# Patient Record
Sex: Female | Born: 1990 | Race: Black or African American | Hispanic: No | Marital: Single | Smoking: Never smoker
Health system: Southern US, Community
[De-identification: ages and names within clinical notes are randomized; demographics above are authoritative.]

## PROBLEM LIST (undated history)

## (undated) ENCOUNTER — Inpatient Hospital Stay: Payer: Self-pay

## (undated) DIAGNOSIS — Z789 Other specified health status: Secondary | ICD-10-CM

## (undated) HISTORY — PX: NO PAST SURGERIES: SHX2092

## (undated) HISTORY — PX: LIPOSUCTION: SHX10

---

## 2016-07-19 ENCOUNTER — Encounter: Payer: Self-pay | Admitting: Certified Nurse Midwife

## 2016-07-19 ENCOUNTER — Ambulatory Visit (INDEPENDENT_AMBULATORY_CARE_PROVIDER_SITE_OTHER): Payer: Medicaid Other | Admitting: Certified Nurse Midwife

## 2016-07-19 VITALS — BP 106/71 | HR 93 | Temp 99.4°F | Ht 62.0 in | Wt 142.8 lb

## 2016-07-19 DIAGNOSIS — K641 Second degree hemorrhoids: Secondary | ICD-10-CM

## 2016-07-19 DIAGNOSIS — O0932 Supervision of pregnancy with insufficient antenatal care, second trimester: Secondary | ICD-10-CM

## 2016-07-19 DIAGNOSIS — Z3482 Encounter for supervision of other normal pregnancy, second trimester: Secondary | ICD-10-CM

## 2016-07-19 DIAGNOSIS — Z3480 Encounter for supervision of other normal pregnancy, unspecified trimester: Secondary | ICD-10-CM | POA: Insufficient documentation

## 2016-07-19 MED ORDER — HYDROCORTISONE ACETATE 25 MG RE SUPP: 25.0000 mg | Freq: Two times a day (BID) | RECTAL | 99 refills | Status: DC

## 2016-07-19 NOTE — Patient Instructions (Addendum)
Hemorrhoids Hemorrhoids are swollen veins around the rectum or anus. There are two types of hemorrhoids:   Internal hemorrhoids. These occur in the veins just inside the rectum. They may poke through to the outside and become irritated and painful.  External hemorrhoids. These occur in the veins outside the anus and can be felt as a painful swelling or hard lump near the anus. CAUSES  Pregnancy.   Obesity.   Constipation or diarrhea.   Straining to have a bowel movement.   Sitting for long periods on the toilet.  Heavy lifting or other activity that caused you to strain.  Anal intercourse. SYMPTOMS   Pain.   Anal itching or irritation.   Rectal bleeding.   Fecal leakage.   Anal swelling.   One or more lumps around the anus.  DIAGNOSIS  Your caregiver may be able to diagnose hemorrhoids by visual examination. Other examinations or tests that may be performed include:   Examination of the rectal area with a gloved hand (digital rectal exam).   Examination of anal canal using a small tube (scope).   A blood test if you have lost a significant amount of blood.  A test to look inside the colon (sigmoidoscopy or colonoscopy). TREATMENT Most hemorrhoids can be treated at home. However, if symptoms do not seem to be getting better or if you have a lot of rectal bleeding, your caregiver may perform a procedure to help make the hemorrhoids get smaller or remove them completely. Possible treatments include:   Placing a rubber band at the base of the hemorrhoid to cut off the circulation (rubber band ligation).   Injecting a chemical to shrink the hemorrhoid (sclerotherapy).   Using a tool to burn the hemorrhoid (infrared light therapy).   Surgically removing the hemorrhoid (hemorrhoidectomy).   Stapling the hemorrhoid to block blood flow to the tissue (hemorrhoid stapling).  HOME CARE INSTRUCTIONS   Eat foods with fiber, such as whole grains, beans,  nuts, fruits, and vegetables. Ask your doctor about taking products with added fiber in them (fibersupplements).  Increase fluid intake. Drink enough water and fluids to keep your urine clear or pale yellow.   Exercise regularly.   Go to the bathroom when you have the urge to have a bowel movement. Do not wait.   Avoid straining to have bowel movements.   Keep the anal area dry and clean. Use wet toilet paper or moist towelettes after a bowel movement.   Medicated creams and suppositories may be used or applied as directed.   Only take over-the-counter or prescription medicines as directed by your caregiver.   Take warm sitz baths for 15-20 minutes, 3-4 times a day to ease pain and discomfort.   Place ice packs on the hemorrhoids if they are tender and swollen. Using ice packs between sitz baths may be helpful.   Put ice in a plastic bag.   Place a towel between your skin and the bag.   Leave the ice on for 15-20 minutes, 3-4 times a day.   Do not use a donut-shaped pillow or sit on the toilet for long periods. This increases blood pooling and pain.  SEEK MEDICAL CARE IF:  You have increasing pain and swelling that is not controlled by treatment or medicine.  You have uncontrolled bleeding.  You have difficulty or you are unable to have a bowel movement.  You have pain or inflammation outside the area of the hemorrhoids. MAKE SURE YOU:  Understand these instructions.    Will watch your condition.  Will get help right away if you are not doing well or get worse.   This information is not intended to replace advice given to you by your health care provider. Make sure you discuss any questions you have with your health care provider.   Document Released: 10/05/2000 Document Revised: 09/24/2012 Document Reviewed: 08/12/2012 Elsevier Interactive Patient Education 2016 [employer]. Pregnancy, The Father's Role A father has an important role during his  partner's pregnancy, labor, delivery, and after the birth of the baby. It is important to help and support your partner through this new period. There are many physical and emotional changes that happen. To be helpful and supportive during this time, you should know and understand what is happening to your partner during pregnancy, labor, delivery, and after the baby is born. WHAT ARE THE STAGES OF PREGNANCY? Pregnancy usually lasts about 40 weeks. The pregnancy is divided into three trimesters. First Trimester During the first 13 weeks, your partner may:  Feel tired.  Have painful breasts.  Feel nauseous or throw up.  Urinate more often.  Have mood changes. All of these changes are normal. If they are happening, try to be helpful, supportive, and understanding. This may include helping with household duties and activities and spending more time with each other. Second Trimester During the next 14-28 weeks:  Your partner will likely feel better and more energetic.  This is the best time of the pregnancy to be more active together.  You will be able to see her belly showing the pregnancy.  You may be able to feel the baby kick.  Your partner may have soreness or aching in her back as she gains weight. You can help her by carrying heavy things and by rubbing her back when she is feeling sore. Third Trimester During the final 12 weeks, your partner may:  Become more uncomfortable as the baby grows.  Have a hard time doing everyday activities, and her balance may be off.  Have a hard time bending over.  Tire easily.  Have difficulty sleeping. At this time, the birth of your baby is close. You and your partner may have concerns or questions. This is normal. Talk with each other and with your health care provider. Continue to help your partner with housework, encourage her to rest, and rub her sore back and legs, if this helps her. WHAT CAN I EXPECT OR DO DURING THE PREGNANCY? You  can expect to experience some changes. There are also many things you can do to help prepare you and your partner for your baby. Emotional Changes During your partner's pregnancy, emotional changes for you may include:  Having feelings of happiness, excitement, and pride.  Being concerned about having new responsibilities, such as financial or educational responsibilities.  Feeling overwhelmed or scared.  Being worried that a baby will change your relationship with your partner. These feelings are normal. Talk about them openly with your partner and your health care provider. Prenatal Care Attend prenatal care visits with your partner. This is a good time for you to get to know your health care provider, follow the pregnancy, and ask questions.  Prenatal visits usually occur one time each month for six months, then every two weeks for two months, and then one time each week during the last month. You may have more prenatal visits if your health care provider believes this is needed.  Your health care provider usually does an ultrasound of the baby  at one of the prenatal visits. This may happen more often if your health care provider thinks it is needed. Sexual Activity Sexual intercourse is safe unless there is a problem with the pregnancy and your health care provider advises you to not have sexual intercourse. Because physical and emotional changes happen in pregnancy, your partner may not want to have sex during certain times. Trying different positions may make sexual intercourse more comfortable. However, always respect your partner's decision if she does not want to have sex. It is important for both of you to discuss your feelings and desires. Talk with your health care provider about any questions that you may have about sexual intercourse during pregnancy. Childbirth Classes Attend childbirth classes with your partner if you are able. Classes prepare you and help you to understand what  happens during labor and delivery, and they help you and your partner to bond. There are even some classes that are only for new fathers. Classes also teach you and your partner:  Various relaxation techniques.  How to work with her labor pains.  How to focus during labor and delivery. WHAT SHOULD I KNOW ABOUT LABOR AND DELIVERY? Many fathers want to be present while their partner is going through labor and delivery. You may:  Be asked to time the contractions, massage your partner's back, and breathe with her during the contractions.  Get to see and enjoy the excitement of your baby being born, and you may even be able to cut your baby's umbilical cord. If you feel like you might faint or you are uncomfortable, ask someone to help you.  Need to leave the room if a problem develops during labor or delivery. A cesarean delivery, or C-section, is a procedure that may be used to deliver the baby. It is done through an incision in the abdomen and the uterus. A cesarean delivery may be scheduled or it may be an emergency procedure during labor and delivery. Most hospitals allow the father to be in the room for a cesarean delivery unless it is an emergency. Recovery from a cesarean delivery usually requires more help from the father. WHAT HAPPENS AFTER DELIVERY? After your baby is born, your partner will go through many changes again. These changes could last a few months or longer. Postpartum Depression Your partner may take awhile to regain her strength. She may also have feelings of sadness (postpartum blues or postpartum depression). If your partner is acting unusually sad or depressed, talk with your health care provider right away. This can be a serious medical condition that requires treatment. Breastfeeding Your partner may decide to breastfeed the baby. This helps with bonding between the mother and the baby, and breast milk is the best nutrition for your baby. You can feel included by  burping the baby and bottle-feeding the baby with breast milk that was collected from the mother. This allows your partner to rest and helps you to bond with your baby. Sexual Activity It may take a few months for your partner's body to heal and be ready for sexual intercourse again. This may take longer after a cesarean delivery. If you have any questions about having sexual intercourse or if it is painful for your partner, talk with your health care provider. It is possible for breastfeeding mothers to become pregnant even if they are not having menstrual periods. Use birth control (contraception) unless you and your partner would like to become pregnant again. WHAT SHOULD I REMEMBER? Fatherhood and having a  baby is an ongoing learning experience. It is common to be anxious, concerned, or afraid that you may not be taking care of your newborn baby properly. It is important to talk with your partner and your health care provider if you are worried or have any questions.   This information is not intended to replace advice given to you by your health care provider. Make sure you discuss any questions you have with your health care provider.   Document Released: 03/26/2008 Document Revised: 10/29/2014 Document Reviewed: 06/25/2014 Elsevier Interactive Patient Education 2016 [employer]. Preterm Labor Information Preterm labor is when labor starts at less than 37 weeks of pregnancy. The normal length of a pregnancy is 39 to 41 weeks. CAUSES Often, there is no identifiable underlying cause as to why a woman goes into preterm labor. One of the most common known causes of preterm labor is infection. Infections of the uterus, cervix, vagina, amniotic sac, bladder, kidney, or even the lungs (pneumonia) can cause labor to start. Other suspected causes of preterm labor include:   Urogenital infections, such as yeast infections and bacterial vaginosis.   Uterine abnormalities (uterine shape, uterine  septum, fibroids, or bleeding from the placenta).   A cervix that has been operated on (it may fail to stay closed).   Malformations in the fetus.   Multiple gestations (twins, triplets, and so on).   Breakage of the amniotic sac.  RISK FACTORS  Having a previous history of preterm labor.   Having premature rupture of membranes (PROM).   Having a placenta that covers the opening of the cervix (placenta previa).   Having a placenta that separates from the uterus (placental abruption).   Having a cervix that is too weak to hold the fetus in the uterus (incompetent cervix).   Having too much fluid in the amniotic sac (polyhydramnios).   Taking illegal drugs or smoking while pregnant.   Not gaining enough weight while pregnant.   Being younger than 17 and older than 25 years old.   Having a low socioeconomic status.   Being African American. SYMPTOMS Signs and symptoms of preterm labor include:   Menstrual-like cramps, abdominal pain, or back pain.  Uterine contractions that are regular, as frequent as six in an hour, regardless of their intensity (may be mild or painful).  Contractions that start on the top of the uterus and spread down to the lower abdomen and back.   A sense of increased pelvic pressure.   A watery or bloody mucus discharge that comes from the vagina.  TREATMENT Depending on the length of the pregnancy and other circumstances, your health care provider may suggest bed rest. If necessary, there are medicines that can be given to stop contractions and to mature the fetal lungs. If labor happens before 34 weeks of pregnancy, a prolonged hospital stay may be recommended. Treatment depends on the condition of both you and the fetus.  WHAT SHOULD YOU DO IF YOU THINK YOU ARE IN PRETERM LABOR? Call your health care provider right away. You will need to go to the hospital to get checked immediately. HOW CAN YOU PREVENT PRETERM LABOR IN FUTURE  PREGNANCIES? You should:   Stop smoking if you smoke.  Maintain healthy weight gain and avoid chemicals and drugs that are not necessary.  Be watchful for any type of infection.  Inform your health care provider if you have a known history of preterm labor.   This information is not intended to replace advice given  to you by your health care provider. Make sure you discuss any questions you have with your health care provider.   Document Released: 12/29/2003 Document Revised: 06/10/2013 Document Reviewed: 11/10/2012 Elsevier Interactive Patient Education 2016 [employer] Second Trimester of Pregnancy The second trimester is from week 13 through week 28, months 4 through 6. The second trimester is often a time when you feel your best. Your body has also adjusted to being pregnant, and you begin to feel better physically. Usually, morning sickness has lessened or quit completely, you may have more energy, and you may have an increase in appetite. The second trimester is also a time when the fetus is growing rapidly. At the end of the sixth month, the fetus is about 9 inches long and weighs about 1 pounds. You will likely begin to feel the baby move (quickening) between 18 and 20 weeks of the pregnancy. BODY CHANGES Your body goes through many changes during pregnancy. The changes vary from woman to woman.   Your weight will continue to increase. You will notice your lower abdomen bulging out.  You may begin to get stretch marks on your hips, abdomen, and breasts.  You may develop headaches that can be relieved by medicines approved by your health care provider.  You may urinate more often because the fetus is pressing on your bladder.  You may develop or continue to have heartburn as a result of your pregnancy.  You may develop constipation because certain hormones are causing the muscles that push waste through your intestines to slow down.  You may develop hemorrhoids or swollen,  bulging veins (varicose veins).  You may have back pain because of the weight gain and pregnancy hormones relaxing your joints between the bones in your pelvis and as a result of a shift in weight and the muscles that support your balance.  Your breasts will continue to grow and be tender.  Your gums may bleed and may be sensitive to brushing and flossing.  Dark spots or blotches (chloasma, mask of pregnancy) may develop on your face. This will likely fade after the baby is born.  A dark line from your belly button to the pubic area (linea nigra) may appear. This will likely fade after the baby is born.  You may have changes in your hair. These can include thickening of your hair, rapid growth, and changes in texture. Some women also have hair loss during or after pregnancy, or hair that feels dry or thin. Your hair will most likely return to normal after your baby is born. WHAT TO EXPECT AT YOUR PRENATAL VISITS During a routine prenatal visit:  You will be weighed to make sure you and the fetus are growing normally.  Your blood pressure will be taken.  Your abdomen will be measured to track your baby's growth.  The fetal heartbeat will be listened to.  Any test results from the previous visit will be discussed. Your health care provider may ask you:  How you are feeling.  If you are feeling the baby move.  If you have had any abnormal symptoms, such as leaking fluid, bleeding, severe headaches, or abdominal cramping.  If you are using any tobacco products, including cigarettes, chewing tobacco, and electronic cigarettes.  If you have any questions. Other tests that may be performed during your second trimester include:  Blood tests that check for:  Low iron levels (anemia).  Gestational diabetes (between 24 and 28 weeks).  Rh antibodies.  Urine tests  to check for infections, diabetes, or protein in the urine.  An ultrasound to confirm the proper growth and development  of the baby.  An amniocentesis to check for possible genetic problems.  Fetal screens for spina bifida and Down syndrome.  HIV (human immunodeficiency virus) testing. Routine prenatal testing includes screening for HIV, unless you choose not to have this test. HOME CARE INSTRUCTIONS   Avoid all smoking, herbs, alcohol, and unprescribed drugs. These chemicals affect the formation and growth of the baby.  Do not use any tobacco products, including cigarettes, chewing tobacco, and electronic cigarettes. If you need help quitting, ask your health care provider. You may receive counseling support and other resources to help you quit.  Follow your health care provider's instructions regarding medicine use. There are medicines that are either safe or unsafe to take during pregnancy.  Exercise only as directed by your health care provider. Experiencing uterine cramps is a good sign to stop exercising.  Continue to eat regular, healthy meals.  Wear a good support bra for breast tenderness.  Do not use hot tubs, steam rooms, or saunas.  Wear your seat belt at all times when driving.  Avoid raw meat, uncooked cheese, cat litter boxes, and soil used by cats. These carry germs that can cause birth defects in the baby.  Take your prenatal vitamins.  Take 1500-2000 mg of calcium daily starting at the 20th week of pregnancy until you deliver your baby.  Try taking a stool softener (if your health care provider approves) if you develop constipation. Eat more high-fiber foods, such as fresh vegetables or fruit and whole grains. Drink plenty of fluids to keep your urine clear or pale yellow.  Take warm sitz baths to soothe any pain or discomfort caused by hemorrhoids. Use hemorrhoid cream if your health care provider approves.  If you develop varicose veins, wear support hose. Elevate your feet for 15 minutes, 3-4 times a day. Limit salt in your diet.  Avoid heavy lifting, wear low heel shoes, and  practice good posture.  Rest with your legs elevated if you have leg cramps or low back pain.  Visit your dentist if you have not gone yet during your pregnancy. Use a soft toothbrush to brush your teeth and be gentle when you floss.  A sexual relationship may be continued unless your health care provider directs you otherwise.  Continue to go to all your prenatal visits as directed by your health care provider. SEEK MEDICAL CARE IF:   You have dizziness.  You have mild pelvic cramps, pelvic pressure, or nagging pain in the abdominal area.  You have persistent nausea, vomiting, or diarrhea.  You have a bad smelling vaginal discharge.  You have pain with urination. SEEK IMMEDIATE MEDICAL CARE IF:   You have a fever.  You are leaking fluid from your vagina.  You have spotting or bleeding from your vagina.  You have severe abdominal cramping or pain.  You have rapid weight gain or loss.  You have shortness of breath with chest pain.  You notice sudden or extreme swelling of your face, hands, ankles, feet, or legs.  You have not felt your baby move in over an hour.  You have severe headaches that do not go away with medicine.  You have vision changes.   This information is not intended to replace advice given to you by your health care provider. Make sure you discuss any questions you have with your health care provider.  Document Released: 10/02/2001 Document Revised: 10/29/2014 Document Reviewed: 12/09/2012 Elsevier Interactive Patient Education 2016 [employer] Inc[employer] Surgical Procedures for Hemorrhoids Surgical procedures can be used to treat hemorrhoids. Hemorrhoids are swollen veins that are inside the rectum (internal hemorrhoids) or around the anus (external hemorrhoids). They are caused by increased pressure in the anal area. This pressure may result from straining to have a bowel movement (constipation), diarrhea, pregnancy, obesity, anal sex, or sitting for long  periods of time. Hemorrhoids can cause symptoms such as pain and bleeding. Surgery may be needed if diet changes, lifestyle changes, and other treatments do not help your symptoms. Various surgical methods may be used. Three common methods are:  Closed hemorrhoidectomy. The hemorrhoids are surgically removed, and the surgical cuts (incisions) are closed with stitches (sutures).  Open hemorrhoidectomy. The hemorrhoids are surgically removed, but the incisions are allowed to heal without sutures.  Stapled hemorrhoidopexy. The hemorrhoids are removed using a device that takes out a ring of excess tissue. LET [facility] CARE PROVIDER KNOW ABOUT:  Any allergies you have.  All medicines you are taking, including vitamins, herbs, eye drops, creams, and over-the-counter medicines.  Previous problems you or members of your family have had with the use of anesthetics.  Any blood disorders you have.  Previous surgeries you have had.  Any medical conditions you have.  Whether you are pregnant or may be pregnant. RISKS AND COMPLICATIONS Generally, this is a safe procedure. However, problems may occur, including:  Infection.  Bleeding.  Allergic reactions to medicines.  Damage to other structures or organs.  Pain.  Constipation.  Difficulty passing urine.  Narrowing of the anal canal (stenosis).  Difficulty controlling bowel movements (incontinence). BEFORE THE PROCEDURE  Ask your health care provider about:  Changing or stopping your regular medicines. This is especially important if you are taking diabetes medicines or blood thinners.  Taking medicines such as aspirin and ibuprofen. These medicines can thin your blood. Do not take these medicines before your procedure if your health care provider instructs you not to.  You may need to have a procedure to examine the inside of your colon with a scope (colonoscopy). Your health care provider may do this to make sure that there  are no other causes for your bleeding or pain.  Follow instructions from your health care provider about eating or drinking restrictions.  You may be instructed to take a laxative and an enema to clean out your colon before surgery (bowel prep). Carefully follow instructions from your health care provider about bowel prep.  Ask your health care provider how your surgical site will be marked or identified.  You may be given antibiotic medicine to help prevent infection.  Plan to have someone take you home after the procedure. PROCEDURE  To reduce your risk of infection:  Your health care team will wash or sanitize their hands.  Your skin will be washed with soap.  An IV tube will be inserted into one of your veins.  You will be given one or more of the following:  A medicine to help you relax (sedative).  A medicine to numb the area (local anesthetic).  A medicine to make you fall asleep (general anesthetic).  A medicine that is injected into an area of your body to numb everything below the injection site (regional anesthetic).  A lubricating jelly may be placed into your rectum.  Your surgeon will insert a short scope (anoscope) into your rectum to examine the hemorrhoids.  One  of the following hemorrhoid procedures will be performed. Closed Hemorrhoidectomy  Your surgeon will use surgical instruments to open the tissue around the hemorrhoids.  The veins that supply the hemorrhoids will be tied off with a suture.  The hemorrhoids will be removed.  The tissue that surrounds the hemorrhoids will be closed with sutures that your body can absorb (absorbable sutures). Open Hemorrhoidectomy  The hemorrhoids will be removed with surgical instruments.  The incisions will be left open to heal without sutures. Stapled Hemorrhoidopexy  Your surgeon will use a circular stapling device to remove the hemorrhoids.  The device will be inserted into your anus. It will remove a  circular ring of tissue that includes hemorrhoid tissue and some tissue above the hemorrhoids.  The staples in the device will close the edges of removed tissue. This will cut off the blood supply to the hemorrhoids and will pull any remaining hemorrhoids back into place. Each of these procedures may vary among health care providers and hospitals. AFTER THE PROCEDURE  Your blood pressure, heart rate, breathing rate, and blood oxygen level will be monitored often until the medicines you were given have worn off.  You will be given pain medicine as needed.   This information is not intended to replace advice given to you by your health care provider. Make sure you discuss any questions you have with your health care provider.   Document Released: 08/05/2009 Document Revised: 06/29/2015 Document Reviewed: 01/03/2015 Elsevier Interactive Patient Education [employer]

## 2016-07-19 NOTE — Progress Notes (Signed)
Subjective:    Michelle Norman is being seen today for her first obstetrical visit.  This is not a planned pregnancy. She is at [redacted]w[redacted]d gestation. Her obstetrical history is significant for none. Relationship with FOB: significant other, living together. Patient does intend to breast feed. Pregnancy history fully reviewed.  Has a [employer] degree.  Currently employed.   The information documented in the HPI was reviewed and verified.  Menstrual History: OB History    Gravida Para Term Preterm AB Living   2 1 1  0 0 1   SAB TAB Ectopic Multiple Live Births   0 0 0 0 1      Menarche age: 25 years of age.  Patient's last menstrual period was 03/14/2016 (exact date).    History reviewed. No pertinent past medical history.  History reviewed. No pertinent surgical history.   (Not in a hospital admission) Not on File  Social History  Substance Use Topics  . Smoking status: Never Smoker  . Smokeless tobacco: Not on file  . Alcohol use No    Family History  Problem Relation Age of Onset  . Cancer Paternal Grandmother      Review of Systems Constitutional: negative for weight loss Gastrointestinal: negative for vomiting, neg for nausea, had it at the start of pregnancy Genitourinary:negative for genital lesions and vaginal discharge and dysuria Musculoskeletal:negative for back pain Behavioral/Psych: negative for abusive relationship, depression, illegal drug usage and tobacco use    Objective:    BP 106/71   Pulse 93   Temp 99.4 F (37.4 C)   Ht 5\' 2"  (1.575 m)   Wt 142 lb 12.8 oz (64.8 kg)   LMP 03/14/2016 (Exact Date)   BMI 26.12 kg/m  General Appearance:    Alert, cooperative, no distress, appears stated age  Head:    Normocephalic, without obvious abnormality, atraumatic  Eyes:    PERRL, conjunctiva/corneas clear, EOM's intact, fundi    benign, both eyes  Ears:    Normal TM's and external ear canals, both ears  Nose:   Nares normal, septum midline, mucosa normal, no drainage     or sinus tenderness  Throat:   Lips, mucosa, and tongue normal; teeth and gums normal  Neck:   Supple, symmetrical, trachea midline, no adenopathy;    thyroid:  no enlargement/tenderness/nodules; no carotid   bruit or JVD  Back:     Symmetric, no curvature, ROM normal, no CVA tenderness  Lungs:     Clear to auscultation bilaterally, respirations unlabored  Chest Wall:    No tenderness or deformity   Heart:    Regular rate and rhythm, S1 and S2 normal, no murmur, rub   or gallop  Breast Exam:    No tenderness, masses, or nipple abnormality  Abdomen:     Soft, non-tender, bowel sounds active all four quadrants,    no masses, no organomegaly  Genitalia:    Normal female without lesion, discharge or tenderness  Extremities:   Extremities normal, atraumatic, no cyanosis or edema  Pulses:   2+ and symmetric all extremities  Skin:   Skin color, texture, turgor normal, no rashes or lesions  Lymph nodes:   Cervical, supraclavicular, and axillary nodes normal  Neurologic:   CNII-XII intact, normal strength, sensation and reflexes    throughout         Cervix:  Long, thick, closed, and posterior.  FHR:  FH:      Lab Review Urine pregnancy test Labs reviewed no Radiologic studies reviewed  no Assessment:    Pregnancy at 4250w1d weeks   Hemorrhoid  Plan:      Prenatal vitamins.  Counseling provided regarding continued use of seat belts, cessation of alcohol consumption, smoking or use of illicit drugs; infection precautions i.e., influenza/TDAP immunizations, toxoplasmosis,CMV, parvovirus, listeria and varicella; workplace safety, exercise during pregnancy; routine dental care, safe medications, sexual activity, hot tubs, saunas, pools, travel, caffeine use, fish and methlymercury, potential toxins, hair treatments, varicose veins Weight gain recommendations per IOM guidelines reviewed: underweight/BMI< 18.5--> gain 28 - 40 lbs; normal weight/BMI 18.5 - 24.9--> gain 25 - 35 lbs; overweight/BMI 25  - 29.9--> gain 15 - 25 lbs; obese/BMI >30->gain  11 - 20 lbs Problem list reviewed and updated. FIRST/CF mutation testing/NIPT/QUAD SCREEN/fragile X/Ashkenazi Jewish population testing/Spinal muscular atrophy discussed: ordered. Role of ultrasound in pregnancy discussed; fetal survey: ordered. Amniocentesis discussed: not indicated. VBAC calculator score: VBAC consent form provided Meds ordered this encounter  Medications  . Prenatal Vit-Fe Fumarate-FA (PRENATAL MULTIVITAMIN) TABS tablet    Sig: Take 1 tablet by mouth daily at 12 noon.  Marland Kitchen. DISCONTD: hydrocortisone (ANUSOL-HC) 25 MG suppository    Sig: Place 1 suppository (25 mg total) rectally 2 (two) times daily.    Dispense:  12 suppository    Refill:  PRN  . hydrocortisone (ANUSOL-HC) 25 MG suppository    Sig: Place 1 suppository (25 mg total) rectally 2 (two) times daily.    Dispense:  12 suppository    Refill:  PRN   Orders Placed This Encounter  Procedures  . Culture, OB Urine  . US OB Comp + 14 Wk    Standing Status:   Future    Standing Expiration Date:   09/18/2017    Order Specific Question:   Reason for Exam (SYMPTOM  OR DIAGNOSIS REQUIRED)    Answer:   fetal anatomy scan, dating    Order Specific Question:   Preferred imaging location?    Answer:   Internal  . US MFM OB COMP + 14 WK    Standing Status:   Future    Standing Expiration Date:   09/18/2017    Order Specific Question:   Reason for Exam (SYMPTOM  OR DIAGNOSIS REQUIRED)    Answer:   fetal anatomy scan    Order Specific Question:   Preferred imaging location?    Answer:   [facility]-Ultrasound  . TSH  . HIV antibody  . Hemoglobinopathy evaluation  . Varicella zoster antibody, IgG  . Prenatal Profile I  . MaterniT21 PLUS Core+SCA    Order Specific Question:   Is the patient insulin dependent?    Answer:   No    Order Specific Question:   Please enter gestational age. This should be expressed as weeks AND days, i.e. 16w 6d. Enter weeks here. Enter days in next  question.    Answer:   1418    Order Specific Question:   Please enter gestational age. This should be expressed as weeks AND days, i.e. 16w 6d. Enter days here. Enter weeks in previous question.    Answer:   1    Order Specific Question:   How was gestational age calculated?    Answer:   LMP    Order Specific Question:   Please give the date of LMP OR Ultrasound OR Estimated date of delivery.    Answer:   12/19/2016    Order Specific Question:   Number of Fetuses (Type of Pregnancy):    Answer:   1  Order Specific Question:   Indications for performing the test? (please choose all that apply):    Answer:   Routine screening    Order Specific Question:   Other Indications? (Y=Yes, N=No)    Answer:   N    Order Specific Question:   If this is a repeat specimen, please indicate the reason:    Answer:   Not indicated    Order Specific Question:   Please specify the patient's race: (C=White/Caucasion, B=Black, I=Native American, A=Asian, H=Hispanic, O=Other, U=Unknown)    Answer:   B    Order Specific Question:   Donor Egg - indicate if the egg was obtained from in vitro fertilization.    Answer:   N    Order Specific Question:   Age of Egg Donor.    Answer:   38    Order Specific Question:   Prior Down Syndrome/ONTD screening during current pregnancy.    Answer:   N    Order Specific Question:   Prior First Trimester Testing    Answer:   N    Order Specific Question:   Prior Second Trimester Testing    Answer:   N    Order Specific Question:   Family History of Neural Tube Defects    Answer:   N    Order Specific Question:   Prior Pregnancy with Down Syndrome    Answer:   N    Order Specific Question:   Please give the patient's weight (in pounds)    Answer:   142.8  . ToxASSURE Select 13 (MW), Urine  . Hemoglobin A1c    Follow up in 4 weeks. 50% of 30 min visit spent on counseling and coordination of care.

## 2016-07-19 NOTE — Progress Notes (Signed)
Patient states that she is having really bad hemmorroids and it is painful for her to sit down.

## 2016-07-20 ENCOUNTER — Other Ambulatory Visit: Payer: Self-pay | Admitting: Certified Nurse Midwife

## 2016-07-21 LAB — CULTURE, OB URINE

## 2016-07-21 LAB — URINE CULTURE, OB REFLEX

## 2016-07-24 ENCOUNTER — Other Ambulatory Visit: Payer: Self-pay | Admitting: Certified Nurse Midwife

## 2016-07-24 DIAGNOSIS — N760 Acute vaginitis: Principal | ICD-10-CM

## 2016-07-24 DIAGNOSIS — B9689 Other specified bacterial agents as the cause of diseases classified elsewhere: Secondary | ICD-10-CM

## 2016-07-24 LAB — NUSWAB VG+, CANDIDA 6SP
ATOPOBIUM VAGINAE: HIGH {score} — AB
BVAB 2: HIGH Score — AB
CANDIDA KRUSEI, NAA: NEGATIVE
CANDIDA LUSITANIAE, NAA: NEGATIVE
CANDIDA PARAPSILOSIS, NAA: NEGATIVE
CANDIDA TROPICALIS, NAA: NEGATIVE
CHLAMYDIA TRACHOMATIS, NAA: NEGATIVE
Candida albicans, NAA: NEGATIVE
Candida glabrata, NAA: NEGATIVE
MEGASPHAERA 1: HIGH {score} — AB
Neisseria gonorrhoeae, NAA: NEGATIVE
TRICH VAG BY NAA: NEGATIVE

## 2016-07-24 MED ORDER — METRONIDAZOLE 500 MG PO TABS: 500.0000 mg | ORAL_TABLET | Freq: Two times a day (BID) | ORAL | 0 refills | Status: DC

## 2016-07-25 LAB — PAP IG W/ RFLX HPV ASCU: PAP Smear Comment: 0

## 2016-07-26 ENCOUNTER — Ambulatory Visit (HOSPITAL_COMMUNITY): Payer: Medicaid Other

## 2016-07-26 LAB — HEMOGLOBINOPATHY EVALUATION
HGB C: 0 %
HGB S: 0 %
Hemoglobin A2 Quantitation: 3 % (ref 0.7–3.1)
Hemoglobin F Quantitation: 1.5 % (ref 0.0–2.0)
Hgb A: 95.5 % (ref 94.0–98.0)

## 2016-07-26 LAB — PRENATAL PROFILE I(LABCORP)
Antibody Screen: NEGATIVE
BASOS ABS: 0 10*3/uL (ref 0.0–0.2)
Basos: 0 %
EOS (ABSOLUTE): 0 10*3/uL (ref 0.0–0.4)
Eos: 0 %
HEMOGLOBIN: 10.5 g/dL — AB (ref 11.1–15.9)
HEP B S AG: NEGATIVE
Hematocrit: 31.9 % — ABNORMAL LOW (ref 34.0–46.6)
IMMATURE GRANS (ABS): 0 10*3/uL (ref 0.0–0.1)
IMMATURE GRANULOCYTES: 0 %
LYMPHS ABS: 1.4 10*3/uL (ref 0.7–3.1)
LYMPHS: 23 %
MCH: 30.9 pg (ref 26.6–33.0)
MCHC: 32.9 g/dL (ref 31.5–35.7)
MCV: 94 fL (ref 79–97)
MONOS ABS: 0.4 10*3/uL (ref 0.1–0.9)
Monocytes: 6 %
NEUTROS PCT: 71 %
Neutrophils Absolute: 4.4 10*3/uL (ref 1.4–7.0)
PLATELETS: 270 10*3/uL (ref 150–379)
RBC: 3.4 x10E6/uL — AB (ref 3.77–5.28)
RDW: 13 % (ref 12.3–15.4)
RPR Ser Ql: NONREACTIVE
Rh Factor: POSITIVE
Rubella Antibodies, IGG: 2.44 index (ref >0.99)
WBC: 6.1 10*3/uL (ref 3.4–10.8)

## 2016-07-26 LAB — HEMOGLOBIN A1C
ESTIMATED AVERAGE GLUCOSE: 88 mg/dL
HEMOGLOBIN A1C: 4.7 % — AB (ref 4.8–5.6)

## 2016-07-26 LAB — HIV ANTIBODY (ROUTINE TESTING W REFLEX): HIV SCREEN 4TH GENERATION: NONREACTIVE

## 2016-07-26 LAB — TOXASSURE SELECT 13 (MW), URINE

## 2016-07-26 LAB — TSH: TSH: 1.29 u[IU]/mL (ref 0.450–4.500)

## 2016-07-26 LAB — VARICELLA ZOSTER ANTIBODY, IGG: Varicella zoster IgG: 671 index (ref >165)

## 2016-07-27 ENCOUNTER — Ambulatory Visit (HOSPITAL_COMMUNITY)
Admission: RE | Admit: 2016-07-27 | Discharge: 2016-07-27 | Disposition: A | Discharge status: Home / Self Care | Payer: Medicaid Other | Source: Ambulatory Visit | Attending: Certified Nurse Midwife | Admitting: Certified Nurse Midwife

## 2016-07-27 ENCOUNTER — Other Ambulatory Visit: Payer: Self-pay | Admitting: Certified Nurse Midwife

## 2016-07-27 DIAGNOSIS — Z363 Encounter for antenatal screening for malformations: Secondary | ICD-10-CM | POA: Insufficient documentation

## 2016-07-27 DIAGNOSIS — Z3A19 19 weeks gestation of pregnancy: Secondary | ICD-10-CM | POA: Diagnosis not present

## 2016-07-27 DIAGNOSIS — Z3482 Encounter for supervision of other normal pregnancy, second trimester: Secondary | ICD-10-CM

## 2016-07-30 ENCOUNTER — Other Ambulatory Visit: Payer: Self-pay | Admitting: Certified Nurse Midwife

## 2016-07-30 DIAGNOSIS — Z3480 Encounter for supervision of other normal pregnancy, unspecified trimester: Secondary | ICD-10-CM

## 2016-07-30 LAB — MATERNIT21 PLUS CORE+SCA
CHROMOSOME 13: NEGATIVE
CHROMOSOME 18: NEGATIVE
Chromosome 21: NEGATIVE
PDF: 0
Y Chromosome: DETECTED

## 2016-08-01 ENCOUNTER — Encounter: Payer: Self-pay | Admitting: Certified Nurse Midwife

## 2016-08-03 ENCOUNTER — Other Ambulatory Visit: Payer: Self-pay | Admitting: Certified Nurse Midwife

## 2016-08-03 DIAGNOSIS — Z3482 Encounter for supervision of other normal pregnancy, second trimester: Secondary | ICD-10-CM

## 2016-08-06 ENCOUNTER — Encounter: Payer: Self-pay | Admitting: Certified Nurse Midwife

## 2016-08-06 ENCOUNTER — Encounter: Payer: Self-pay | Admitting: *Deleted

## 2016-08-06 ENCOUNTER — Other Ambulatory Visit: Payer: Self-pay | Admitting: Certified Nurse Midwife

## 2016-08-06 ENCOUNTER — Other Ambulatory Visit: Payer: Self-pay | Admitting: *Deleted

## 2016-08-06 DIAGNOSIS — O99012 Anemia complicating pregnancy, second trimester: Secondary | ICD-10-CM

## 2016-08-06 MED ORDER — VITAFOL FE+ 90-1-200 & 50 MG PO CPPK: 2.0000 | ORAL_CAPSULE | Freq: Every day | ORAL | 11 refills | Status: DC

## 2016-08-14 ENCOUNTER — Other Ambulatory Visit: Payer: Self-pay | Admitting: Certified Nurse Midwife

## 2016-08-16 ENCOUNTER — Ambulatory Visit (INDEPENDENT_AMBULATORY_CARE_PROVIDER_SITE_OTHER): Payer: Medicaid Other | Admitting: Certified Nurse Midwife

## 2016-08-16 DIAGNOSIS — Z3480 Encounter for supervision of other normal pregnancy, unspecified trimester: Secondary | ICD-10-CM

## 2016-08-16 DIAGNOSIS — Z3482 Encounter for supervision of other normal pregnancy, second trimester: Secondary | ICD-10-CM

## 2016-08-16 NOTE — Patient Instructions (Addendum)
Preterm Labor Information °Preterm labor is when labor starts at less than 37 weeks of pregnancy. The normal length of a pregnancy is 39 to 41 weeks. °CAUSES °Often, there is no identifiable underlying cause as to why a woman goes into preterm labor. One of the most common known causes of preterm labor is infection. Infections of the uterus, cervix, vagina, amniotic sac, bladder, kidney, or even the lungs (pneumonia) can cause labor to start. Other suspected causes of preterm labor include:  °· Urogenital infections, such as yeast infections and bacterial vaginosis.   °· Uterine abnormalities (uterine shape, uterine septum, fibroids, or bleeding from the placenta).   °· A cervix that has been operated on (it may fail to stay closed).   °· Malformations in the fetus.   °· Multiple gestations (twins, triplets, and so on).   °· Breakage of the amniotic sac.   °RISK FACTORS °· Having a previous history of preterm labor.   °· Having premature rupture of membranes (PROM).   °· Having a placenta that covers the opening of the cervix (placenta previa).   °· Having a placenta that separates from the uterus (placental abruption).   °· Having a cervix that is too weak to hold the fetus in the uterus (incompetent cervix).   °· Having too much fluid in the amniotic sac (polyhydramnios).   °· Taking illegal drugs or smoking while pregnant.   °· Not gaining enough weight while pregnant.   °· Being younger than 18 and older than 25 years old.   °· Having a low socioeconomic status.   °· Being African American. °SYMPTOMS °Signs and symptoms of preterm labor include:  °· Menstrual-like cramps, abdominal pain, or back pain. °· Uterine contractions that are regular, as frequent as six in an hour, regardless of their intensity (may be mild or painful). °· Contractions that start on the top of the uterus and spread down to the lower abdomen and back.   °· A sense of increased pelvic pressure.   °· A watery or bloody mucus discharge that  comes from the vagina.   °TREATMENT °Depending on the length of the pregnancy and other circumstances, your health care provider may suggest bed rest. If necessary, there are medicines that can be given to stop contractions and to mature the fetal lungs. If labor happens before 34 weeks of pregnancy, a prolonged hospital stay may be recommended. Treatment depends on the condition of both you and the fetus.  °WHAT SHOULD YOU DO IF YOU THINK YOU ARE IN PRETERM LABOR? °Call your health care provider right away. You will need to go to the hospital to get checked immediately. °HOW CAN YOU PREVENT PRETERM LABOR IN FUTURE PREGNANCIES? °You should:  °· Stop smoking if you smoke.  °· Maintain healthy weight gain and avoid chemicals and drugs that are not necessary. °· Be watchful for any type of infection. °· Inform your health care provider if you have a known history of preterm labor. °  °This information is not intended to replace advice given to you by your health care provider. Make sure you discuss any questions you have with your health care provider. °  °Document Released: 12/29/2003 Document Revised: 06/10/2013 Document Reviewed: 11/10/2012 °Elsevier Interactive Patient Education ©2016 [employer] ° °Second Trimester of Pregnancy °The second trimester is from week 13 through week 28, months 4 through 6. The second trimester is often a time when you feel your best. Your body has also adjusted to being pregnant, and you begin to feel better physically. Usually, morning sickness has lessened or quit completely, you may have more energy, and   you may have an increase in appetite. The second trimester is also a time when the fetus is growing rapidly. At the end of the sixth month, the fetus is about 9 inches long and weighs about 1½ pounds. You will likely begin to feel the baby move (quickening) between 18 and 20 weeks of the pregnancy. °BODY CHANGES °Your body goes through many changes during pregnancy. The changes  vary from woman to woman.  °· Your weight will continue to increase. You will notice your lower abdomen bulging out. °· You may begin to get stretch marks on your hips, abdomen, and breasts. °· You may develop headaches that can be relieved by medicines approved by your health care provider. °· You may urinate more often because the fetus is pressing on your bladder. °· You may develop or continue to have heartburn as a result of your pregnancy. °· You may develop constipation because certain hormones are causing the muscles that push waste through your intestines to slow down. °· You may develop hemorrhoids or swollen, bulging veins (varicose veins). °· You may have back pain because of the weight gain and pregnancy hormones relaxing your joints between the bones in your pelvis and as a result of a shift in weight and the muscles that support your balance. °· Your breasts will continue to grow and be tender. °· Your gums may bleed and may be sensitive to brushing and flossing. °· Dark spots or blotches (chloasma, mask of pregnancy) may develop on your face. This will likely fade after the baby is born. °· A dark line from your belly button to the pubic area (linea nigra) may appear. This will likely fade after the baby is born. °· You may have changes in your hair. These can include thickening of your hair, rapid growth, and changes in texture. Some women also have hair loss during or after pregnancy, or hair that feels dry or thin. Your hair will most likely return to normal after your baby is born. °WHAT TO EXPECT AT YOUR PRENATAL VISITS °During a routine prenatal visit: °· You will be weighed to make sure you and the fetus are growing normally. °· Your blood pressure will be taken. °· Your abdomen will be measured to track your baby's growth. °· The fetal heartbeat will be listened to. °· Any test results from the previous visit will be discussed. °Your health care provider may ask you: °· How you are  feeling. °· If you are feeling the baby move. °· If you have had any abnormal symptoms, such as leaking fluid, bleeding, severe headaches, or abdominal cramping. °· If you are using any tobacco products, including cigarettes, chewing tobacco, and electronic cigarettes. °· If you have any questions. °Other tests that may be performed during your second trimester include: °· Blood tests that check for: °¨ Low iron levels (anemia). °¨ Gestational diabetes (between 24 and 28 weeks). °¨ Rh antibodies. °· Urine tests to check for infections, diabetes, or protein in the urine. °· An ultrasound to confirm the proper growth and development of the baby. °· An amniocentesis to check for possible genetic problems. °· Fetal screens for spina bifida and Down syndrome. °· HIV (human immunodeficiency virus) testing. Routine prenatal testing includes screening for HIV, unless you choose not to have this test. °HOME CARE INSTRUCTIONS  °· Avoid all smoking, herbs, alcohol, and unprescribed drugs. These chemicals affect the formation and growth of the baby. °· Do not use any tobacco products, including cigarettes, chewing tobacco,   and electronic cigarettes. If you need help quitting, ask your health care provider. You may receive counseling support and other resources to help you quit. °· Follow your health care provider's instructions regarding medicine use. There are medicines that are either safe or unsafe to take during pregnancy. °· Exercise only as directed by your health care provider. Experiencing uterine cramps is a good sign to stop exercising. °· Continue to eat regular, healthy meals. °· Wear a good support bra for breast tenderness. °· Do not use hot tubs, steam rooms, or saunas. °· Wear your seat belt at all times when driving. °· Avoid raw meat, uncooked cheese, cat litter boxes, and soil used by cats. These carry germs that can cause birth defects in the baby. °· Take your prenatal vitamins. °· Take 1500-2000 mg of  calcium daily starting at the 20th week of pregnancy until you deliver your baby. °· Try taking a stool softener (if your health care provider approves) if you develop constipation. Eat more high-fiber foods, such as fresh vegetables or fruit and whole grains. Drink plenty of fluids to keep your urine clear or pale yellow. °· Take warm sitz baths to soothe any pain or discomfort caused by hemorrhoids. Use hemorrhoid cream if your health care provider approves. °· If you develop varicose veins, wear support hose. Elevate your feet for 15 minutes, 3-4 times a day. Limit salt in your diet. °· Avoid heavy lifting, wear low heel shoes, and practice good posture. °· Rest with your legs elevated if you have leg cramps or low back pain. °· Visit your dentist if you have not gone yet during your pregnancy. Use a soft toothbrush to brush your teeth and be gentle when you floss. °· A sexual relationship may be continued unless your health care provider directs you otherwise. °· Continue to go to all your prenatal visits as directed by your health care provider. °SEEK MEDICAL CARE IF:  °· You have dizziness. °· You have mild pelvic cramps, pelvic pressure, or nagging pain in the abdominal area. °· You have persistent nausea, vomiting, or diarrhea. °· You have a bad smelling vaginal discharge. °· You have pain with urination. °SEEK IMMEDIATE MEDICAL CARE IF:  °· You have a fever. °· You are leaking fluid from your vagina. °· You have spotting or bleeding from your vagina. °· You have severe abdominal cramping or pain. °· You have rapid weight gain or loss. °· You have shortness of breath with chest pain. °· You notice sudden or extreme swelling of your face, hands, ankles, feet, or legs. °· You have not felt your baby move in over an hour. °· You have severe headaches that do not go away with medicine. °· You have vision changes. °  °This information is not intended to replace advice given to you by your health care provider.  Make sure you discuss any questions you have with your health care provider. °  °Document Released: 10/02/2001 Document Revised: 10/29/2014 Document Reviewed: 12/09/2012 °Elsevier Interactive Patient Education ©2016 [employer] ° °

## 2016-08-16 NOTE — Progress Notes (Signed)
Subjective:    Michelle Norman is a 25 y.o. female being seen today for her obstetrical visit. She is at 869w1d gestation. Patient reports: no complaints . Fetal movement: normal.  Problem List Items Addressed This Visit      Other   Supervision of other normal pregnancy, antepartum    Other Visit Diagnoses   None.    Patient Active Problem List   Diagnosis Date Noted  . Supervision of other normal pregnancy, antepartum 07/19/2016  . Late prenatal care affecting pregnancy in second trimester 07/19/2016   Objective:    BP 115/77   Pulse 74   Wt 149 lb (67.6 kg)   LMP 03/14/2016 (Exact Date)   BMI 27.25 kg/m  FHT: 155 BPM  Uterine Size: 22 cm and size equals dates     Assessment:    Pregnancy @ 4669w1d    Plan:    OBGCT: discussed. Signs and symptoms of preterm labor: discussed and handout given.  Labs, problem list reviewed and updated 2 hr GTT planned Follow up in 4 weeks.

## 2016-08-24 ENCOUNTER — Ambulatory Visit (HOSPITAL_COMMUNITY)
Admission: RE | Admit: 2016-08-24 | Discharge: 2016-08-24 | Disposition: A | Discharge status: Home / Self Care | Payer: Medicaid Other | Source: Ambulatory Visit | Attending: Certified Nurse Midwife | Admitting: Certified Nurse Midwife

## 2016-08-24 ENCOUNTER — Encounter (HOSPITAL_COMMUNITY): Payer: Self-pay

## 2016-08-24 ENCOUNTER — Other Ambulatory Visit: Payer: Self-pay | Admitting: Certified Nurse Midwife

## 2016-08-24 ENCOUNTER — Ambulatory Visit (HOSPITAL_COMMUNITY): Admission: RE | Admit: 2016-08-24 | Payer: Medicaid Other | Source: Ambulatory Visit

## 2016-08-24 DIAGNOSIS — Z3482 Encounter for supervision of other normal pregnancy, second trimester: Secondary | ICD-10-CM

## 2016-08-24 DIAGNOSIS — Z362 Encounter for other antenatal screening follow-up: Secondary | ICD-10-CM

## 2016-08-24 DIAGNOSIS — O35EXX0 Maternal care for other (suspected) fetal abnormality and damage, fetal genitourinary anomalies, not applicable or unspecified: Secondary | ICD-10-CM

## 2016-08-24 DIAGNOSIS — Z3A23 23 weeks gestation of pregnancy: Secondary | ICD-10-CM

## 2016-08-24 DIAGNOSIS — O283 Abnormal ultrasonic finding on antenatal screening of mother: Secondary | ICD-10-CM | POA: Diagnosis not present

## 2016-08-24 DIAGNOSIS — O358XX0 Maternal care for other (suspected) fetal abnormality and damage, not applicable or unspecified: Secondary | ICD-10-CM

## 2016-08-24 HISTORY — DX: Other specified health status: Z78.9

## 2016-08-30 ENCOUNTER — Other Ambulatory Visit: Payer: Self-pay | Admitting: Certified Nurse Midwife

## 2016-08-30 DIAGNOSIS — Z3480 Encounter for supervision of other normal pregnancy, unspecified trimester: Secondary | ICD-10-CM

## 2016-09-10 ENCOUNTER — Ambulatory Visit (INDEPENDENT_AMBULATORY_CARE_PROVIDER_SITE_OTHER): Payer: Medicaid Other | Admitting: Certified Nurse Midwife

## 2016-09-10 VITALS — BP 118/77 | HR 99 | Wt 159.0 lb

## 2016-09-10 DIAGNOSIS — Z3482 Encounter for supervision of other normal pregnancy, second trimester: Secondary | ICD-10-CM

## 2016-09-10 NOTE — Patient Instructions (Addendum)
Preterm Labor and Birth Information Pregnancy normally lasts 39-41 weeks. Preterm labor is when labor starts early. It starts before you have been pregnant for 37 whole weeks. What are the risk factors for preterm labor? Preterm labor is more likely to occur in women who:  Have an infection while pregnant.  Have a cervix that is short.  Have gone into preterm labor before.  Have had surgery on their cervix.  Are younger than age 25.  Are older than age 25.  Are African American.  Are pregnant with two or more babies.  Take street drugs while pregnant.  Smoke while pregnant.  Do not gain enough weight while pregnant.  Got pregnant right after another pregnancy. What are the symptoms of preterm labor? Symptoms of preterm labor include:  Cramps. The cramps may feel like the cramps some women get during their period. The cramps may happen with watery poop (diarrhea).  Pain in the belly (abdomen).  Pain in the lower back.  Regular contractions or tightening. It may feel like your belly is getting tighter.  Pressure in the lower belly that seems to get stronger.  More fluid (discharge) leaking from the vagina. The fluid may be watery or bloody.  Water breaking. Why is it important to notice signs of preterm labor? Babies who are born early may not be fully developed. They have a higher chance for:  Long-term heart problems.  Long-term lung problems.  Trouble controlling body systems, like breathing.  Bleeding in the brain.  A condition called cerebral palsy.  Learning difficulties.  Death. These risks are highest for babies who are born before 34 weeks of pregnancy. How is preterm labor treated? Treatment depends on:  How long you were pregnant.  Your condition.  The health of your baby. Treatment may involve:  Having a stitch (suture) placed in your cervix. When you give birth, your cervix opens so the baby can come out. The stitch keeps the cervix  from opening too soon.  Staying at the hospital.  Taking or getting medicines, such as:  Hormone medicines.  Medicines to stop contractions.  Medicines to help the baby's lungs develop.  Medicines to prevent your baby from having cerebral palsy. What should I do if I am in preterm labor? If you think you are going into labor too soon, call your doctor right away. How can I prevent preterm labor?  Do not use any tobacco products.  Examples of these are cigarettes, chewing tobacco, and e-cigarettes.  If you need help quitting, ask your doctor.  Do not use street drugs.  Do not use any medicines unless you ask your doctor if they are safe for you.  Talk with your doctor before taking any herbal supplements.  Make sure you gain enough weight.  Watch for infection. If you think you might have an infection, get it checked right away.  If you have gone into preterm labor before, tell your doctor. This information is not intended to replace advice given to you by your health care provider. Make sure you discuss any questions you have with your health care provider. Document Released: 01/04/2009 Document Revised: 03/20/2016 Document Reviewed: 02/29/2016 Elsevier Interactive Patient Education  2017 [employer] Second Trimester of Pregnancy The second trimester is from week 13 through week 28 (months 4 through 6). The second trimester is often a time when you feel your best. Your body has also adjusted to being pregnant, and you begin to feel better physically. Usually, morning sickness has  lessened or quit completely, you may have more energy, and you may have an increase in appetite. The second trimester is also a time when the fetus is growing rapidly. At the end of the sixth month, the fetus is about 9 inches long and weighs about 1 pounds. You will likely begin to feel the baby move (quickening) between 18 and 20 weeks of the pregnancy. Body changes during your second  trimester Your body continues to go through many changes during your second trimester. The changes vary from woman to woman.  Your weight will continue to increase. You will notice your lower abdomen bulging out.  You may begin to get stretch marks on your hips, abdomen, and breasts.  You may develop headaches that can be relieved by medicines. The medicines should be approved by your health care provider.  You may urinate more often because the fetus is pressing on your bladder.  You may develop or continue to have heartburn as a result of your pregnancy.  You may develop constipation because certain hormones are causing the muscles that push waste through your intestines to slow down.  You may develop hemorrhoids or swollen, bulging veins (varicose veins).  You may have back pain. This is caused by:  Weight gain.  Pregnancy hormones that are relaxing the joints in your pelvis.  A shift in weight and the muscles that support your balance.  Your breasts will continue to grow and they will continue to become tender.  Your gums may bleed and may be sensitive to brushing and flossing.  Dark spots or blotches (chloasma, mask of pregnancy) may develop on your face. This will likely fade after the baby is born.  A dark line from your belly button to the pubic area (linea nigra) may appear. This will likely fade after the baby is born.  You may have changes in your hair. These can include thickening of your hair, rapid growth, and changes in texture. Some women also have hair loss during or after pregnancy, or hair that feels dry or thin. Your hair will most likely return to normal after your baby is born. What to expect at prenatal visits During a routine prenatal visit:  You will be weighed to make sure you and the fetus are growing normally.  Your blood pressure will be taken.  Your abdomen will be measured to track your baby's growth.  The fetal heartbeat will be listened  to.  Any test results from the previous visit will be discussed. Your health care provider may ask you:  How you are feeling.  If you are feeling the baby move.  If you have had any abnormal symptoms, such as leaking fluid, bleeding, severe headaches, or abdominal cramping.  If you are using any tobacco products, including cigarettes, chewing tobacco, and electronic cigarettes.  If you have any questions. Other tests that may be performed during your second trimester include:  Blood tests that check for:  Low iron levels (anemia).  Gestational diabetes (between 24 and 28 weeks).  Rh antibodies. This is to check for a protein on red blood cells (Rh factor).  Urine tests to check for infections, diabetes, or protein in the urine.  An ultrasound to confirm the proper growth and development of the baby.  An amniocentesis to check for possible genetic problems.  Fetal screens for spina bifida and Down syndrome.  HIV (human immunodeficiency virus) testing. Routine prenatal testing includes screening for HIV, unless you choose not to have this test.  Follow these instructions at home: Eating and drinking  Continue to eat regular, healthy meals.  Avoid raw meat, uncooked cheese, cat litter boxes, and soil used by cats. These carry germs that can cause birth defects in the baby.  Take your prenatal vitamins.  Take 1500-2000 mg of calcium daily starting at the 20th week of pregnancy until you deliver your baby.  If you develop constipation:  Take over-the-counter or prescription medicines.  Drink enough fluid to keep your urine clear or pale yellow.  Eat foods that are high in fiber, such as fresh fruits and vegetables, whole grains, and beans.  Limit foods that are high in fat and processed sugars, such as fried and sweet foods. Activity  Exercise only as directed by your health care provider. Experiencing uterine cramps is a good sign to stop exercising.  Avoid heavy  lifting, wear low heel shoes, and practice good posture.  Wear your seat belt at all times when driving.  Rest with your legs elevated if you have leg cramps or low back pain.  Wear a good support bra for breast tenderness.  Do not use hot tubs, steam rooms, or saunas. Lifestyle  Avoid all smoking, herbs, alcohol, and unprescribed drugs. These chemicals affect the formation and growth of the baby.  Do not use any products that contain nicotine or tobacco, such as cigarettes and e-cigarettes. If you need help quitting, ask your health care provider.  A sexual relationship may be continued unless your health care provider directs you otherwise. General instructions  Follow your health care provider's instructions regarding medicine use. There are medicines that are either safe or unsafe to take during pregnancy.  Take warm sitz baths to soothe any pain or discomfort caused by hemorrhoids. Use hemorrhoid cream if your health care provider approves.  If you develop varicose veins, wear support hose. Elevate your feet for 15 minutes, 3-4 times a day. Limit salt in your diet.  Visit your dentist if you have not gone yet during your pregnancy. Use a soft toothbrush to brush your teeth and be gentle when you floss.  Keep all follow-up prenatal visits as told by your health care provider. This is important. Contact a health care provider if:  You have dizziness.  You have mild pelvic cramps, pelvic pressure, or nagging pain in the abdominal area.  You have persistent nausea, vomiting, or diarrhea.  You have a bad smelling vaginal discharge.  You have pain with urination. Get help right away if:  You have a fever.  You are leaking fluid from your vagina.  You have spotting or bleeding from your vagina.  You have severe abdominal cramping or pain.  You have rapid weight gain or weight loss.  You have shortness of breath with chest pain.  You notice sudden or extreme swelling  of your face, hands, ankles, feet, or legs.  You have not felt your baby move in over an hour.  You have severe headaches that do not go away with medicine.  You have vision changes. Summary  The second trimester is from week 13 through week 28 (months 4 through 6). It is also a time when the fetus is growing rapidly.  Your body goes through many changes during pregnancy. The changes vary from woman to woman.  Avoid all smoking, herbs, alcohol, and unprescribed drugs. These chemicals affect the formation and growth your baby.  Do not use any tobacco products, such as cigarettes, chewing tobacco, and e-cigarettes. If you need help  quitting, ask your health care provider.  Contact your health care provider if you have any questions. Keep all prenatal visits as told by your health care provider. This is important. This information is not intended to replace advice given to you by your health care provider. Make sure you discuss any questions you have with your health care provider. Document Released: 10/02/2001 Document Revised: 03/15/2016 Document Reviewed: 12/09/2012 Elsevier Interactive Patient Education  2017 [employer].

## 2016-09-10 NOTE — Progress Notes (Signed)
Subjective:    Michelle Norman is a 25 y.o. female being seen today for her obstetrical visit. She is at 3041w5d gestation. Patient reports: no complaints . Fetal movement: normal.  Problem List Items Addressed This Visit    None    Visit Diagnoses    Encounter for supervision of other normal pregnancy in second trimester    -  Primary     Patient Active Problem List   Diagnosis Date Noted  . Supervision of other normal pregnancy, antepartum 07/19/2016  . Late prenatal care affecting pregnancy in second trimester 07/19/2016   Objective:    BP 118/77   Pulse 99   Wt 159 lb (72.1 kg)   LMP 03/14/2016 (Exact Date)   BMI 29.08 kg/m  FHT: 147 BPM  Uterine Size: 25 cm and size equals dates     Assessment:    Pregnancy @ 5641w5d    Plan:    OBGCT: discussed and ordered for next visit. Signs and symptoms of preterm labor: discussed.  Labs, problem list reviewed and updated 2 hr GTT planned Follow up in 2 weeks.

## 2016-09-24 ENCOUNTER — Other Ambulatory Visit: Payer: Medicaid Other

## 2016-09-24 ENCOUNTER — Ambulatory Visit (INDEPENDENT_AMBULATORY_CARE_PROVIDER_SITE_OTHER): Payer: Medicaid Other | Admitting: Obstetrics

## 2016-09-24 ENCOUNTER — Encounter: Payer: Self-pay | Admitting: Obstetrics

## 2016-09-24 VITALS — BP 116/67 | HR 87

## 2016-09-24 DIAGNOSIS — Z3483 Encounter for supervision of other normal pregnancy, third trimester: Secondary | ICD-10-CM

## 2016-09-24 DIAGNOSIS — Z3480 Encounter for supervision of other normal pregnancy, unspecified trimester: Secondary | ICD-10-CM

## 2016-09-24 DIAGNOSIS — Z3490 Encounter for supervision of normal pregnancy, unspecified, unspecified trimester: Secondary | ICD-10-CM

## 2016-09-24 NOTE — Progress Notes (Signed)
Subjective:    Michelle Norman is a 25 y.o. female being seen today for her obstetrical visit. She is at 4854w5d gestation. Patient reports: no complaints . Fetal movement: normal.  Problem List Items Addressed This Visit    None    Visit Diagnoses    Encounter for supervision of normal pregnancy, antepartum, unspecified gravidity    -  Primary   Relevant Orders   Glucose Tolerance, 2 Hours w/1 Hour   CBC   RPR   HIV antibody (with reflex)     Patient Active Problem List   Diagnosis Date Noted  . Supervision of other normal pregnancy, antepartum 07/19/2016  . Late prenatal care affecting pregnancy in second trimester 07/19/2016   Objective:    BP 116/67   Pulse 87   LMP 03/14/2016 (Exact Date)  FHT: 150 BPM  Uterine Size: size equals dates     Assessment:    Pregnancy @ 6154w5d    Plan:    OBGCT: ordered. Signs and symptoms of preterm labor: discussed.  Labs, problem list reviewed and updated 2 hr GTT planned Follow up in 2 weeks.

## 2016-09-25 LAB — CBC
HEMATOCRIT: 33.1 % — AB (ref 34.0–46.6)
HEMOGLOBIN: 11.4 g/dL (ref 11.1–15.9)
MCH: 33.7 pg — AB (ref 26.6–33.0)
MCHC: 34.4 g/dL (ref 31.5–35.7)
MCV: 98 fL — ABNORMAL HIGH (ref 79–97)
Platelets: 236 10*3/uL (ref 150–379)
RBC: 3.38 x10E6/uL — AB (ref 3.77–5.28)
RDW: 13.3 % (ref 12.3–15.4)
WBC: 6 10*3/uL (ref 3.4–10.8)

## 2016-09-25 LAB — RPR: RPR Ser Ql: NONREACTIVE

## 2016-09-25 LAB — GLUCOSE TOLERANCE, 2 HOURS W/ 1HR
Glucose, 1 hour: 114 mg/dL (ref 65–179)
Glucose, 2 hour: 136 mg/dL (ref 65–152)
Glucose, Fasting: 88 mg/dL (ref 65–91)

## 2016-09-25 LAB — HIV ANTIBODY (ROUTINE TESTING W REFLEX): HIV SCREEN 4TH GENERATION: NONREACTIVE

## 2016-10-09 ENCOUNTER — Ambulatory Visit (INDEPENDENT_AMBULATORY_CARE_PROVIDER_SITE_OTHER): Payer: Medicaid Other | Admitting: Obstetrics

## 2016-10-09 ENCOUNTER — Encounter: Payer: Self-pay | Admitting: Obstetrics

## 2016-10-09 VITALS — BP 131/80 | HR 96 | Temp 98.6°F | Wt 169.8 lb

## 2016-10-09 DIAGNOSIS — Z3480 Encounter for supervision of other normal pregnancy, unspecified trimester: Secondary | ICD-10-CM

## 2016-10-09 DIAGNOSIS — K219 Gastro-esophageal reflux disease without esophagitis: Secondary | ICD-10-CM

## 2016-10-09 MED ORDER — TETANUS-DIPHTH-ACELL PERTUSSIS 5-2.5-18.5 LF-MCG/0.5 IM SUSP
0.5000 mL | Freq: Once | INTRAMUSCULAR | Status: AC
  Administered 2016-10-09: 0.5 mL via INTRAMUSCULAR

## 2016-10-09 MED ORDER — RANITIDINE HCL 150 MG PO TABS: 150.0000 mg | ORAL_TABLET | Freq: Two times a day (BID) | ORAL | 5 refills | Status: DC

## 2016-10-09 NOTE — Progress Notes (Signed)
Subjective:    Michelle Norman is a 25 y.o. female being seen today for her obstetrical visit. She is at 5036w6d gestation. Patient reports Heartburn. Fetal movement: normal.  Problem List Items Addressed This Visit    Supervision of other normal pregnancy, antepartum    Other Visit Diagnoses    GERD without esophagitis    -  Primary   Relevant Medications   ranitidine (ZANTAC) 150 MG tablet     Patient Active Problem List   Diagnosis Date Noted  . Supervision of other normal pregnancy, antepartum 07/19/2016  . Late prenatal care affecting pregnancy in second trimester 07/19/2016   Objective:    BP 131/80   Pulse 96   Temp 98.6 F (37 C)   Wt 169 lb 12.8 oz (77 kg)   LMP 03/14/2016 (Exact Date)   BMI 31.06 kg/m  FHT:  150 BPM  Uterine Size: size equals dates  Presentation: unsure     Assessment:    Pregnancy @ 7936w6d weeks   Plan:     labs reviewed, problem list updated Consent signed. GBS sent TDAP offered  Rhogam given for RH negative Pediatrician: discussed. Infant feeding: plans to breastfeed. Maternity leave: discussed. Cigarette smoking: never smoked. No orders of the defined types were placed in this encounter.  Meds ordered this encounter  Medications  . ranitidine (ZANTAC) 150 MG tablet    Sig: Take 1 tablet (150 mg total) by mouth 2 (two) times daily.    Dispense:  60 tablet    Refill:  5  . Tdap (BOOSTRIX) injection 0.5 mL   Follow up in 2 Weeks.

## 2016-10-22 NOTE — L&D Delivery Note (Signed)
Operative Delivery Note At 22:57 on 12/17/16  a viable  Female infant was delivered via Presentation: vertex; Position: Occiput,, Anterior. Shoulder dystocia called.  Delivery of the head:  with notable "turtle sign" First maneuver: McRoberts, 30 sec Second maneuver: ,   Suprapubic pressure, delivered right anterior shoulder followed by left anterior shoulder and baby with just under 30 seconds. Third maneuver: ,   N/A Fourth maneuver: ,   Fifth maneuver: ,   Sixth maneuver: ,    Total time of maneuvers after shoulder dystocia called: 1 minute.  Verbal consent: obtained from patient.  APGAR: 7/9 Weight: pending skin to skin Placenta status: Delivered spontaneously intact, Sent to pathology. Cord: 3VC Cord pH: 7.336  Anesthesia:  None Episiotomy: None Lacerations: 1st degree perineal Suture Repair: 3.0 vicryl Est. Blood Loss (mL): 200 cc  Mom to postpartum.  Baby to Couplet care / Skin to Skin.  Jen MowElizabeth Yumi Insalaco, DO OB Fellow 12/17/2016, 11:40 PM

## 2016-10-29 ENCOUNTER — Encounter: Payer: Self-pay | Admitting: Obstetrics

## 2016-10-29 ENCOUNTER — Ambulatory Visit (INDEPENDENT_AMBULATORY_CARE_PROVIDER_SITE_OTHER): Payer: Medicaid Other | Admitting: Obstetrics

## 2016-10-29 DIAGNOSIS — Z3483 Encounter for supervision of other normal pregnancy, third trimester: Secondary | ICD-10-CM

## 2016-10-29 DIAGNOSIS — Z3480 Encounter for supervision of other normal pregnancy, unspecified trimester: Secondary | ICD-10-CM

## 2016-10-29 NOTE — Progress Notes (Signed)
Subjective:    Michelle Norman is a 26 y.o. female being seen today for her obstetrical visit. She is at 7449w5d gestation. Patient reports heartburn. Fetal movement: normal.  Problem List Items Addressed This Visit    Supervision of other normal pregnancy, antepartum     Patient Active Problem List   Diagnosis Date Noted  . Supervision of other normal pregnancy, antepartum 07/19/2016  . Late prenatal care affecting pregnancy in second trimester 07/19/2016   Objective:    BP 123/83   Pulse (!) 103   Wt 168 lb 12.8 oz (76.6 kg)   LMP 03/14/2016 (Exact Date)   BMI 30.87 kg/m  FHT:  150 BPM  Uterine Size: size equals dates  Presentation: unsure     Assessment:    Pregnancy @ 3949w5d weeks   Plan:     labs reviewed, problem list updated Consent signed. GBS sent TDAP offered  Rhogam given for RH negative Pediatrician: discussed. Infant feeding: plans to breastfeed. Maternity leave: discussed. Cigarette smoking: never smoked. No orders of the defined types were placed in this encounter.  No orders of the defined types were placed in this encounter.  Follow up in 2 Weeks.   Patient ID: Michelle Listenbria Chicoine, female   DOB: [birth year], 26 y.o.   MRN: [id number]

## 2016-10-29 NOTE — Progress Notes (Signed)
Patient reports she is doing well 

## 2016-11-12 ENCOUNTER — Encounter: Payer: Medicaid Other | Admitting: Obstetrics

## 2016-11-16 ENCOUNTER — Inpatient Hospital Stay
Admission: EM | Admit: 2016-11-16 | Discharge: 2016-11-16 | Disposition: A | Discharge status: Home / Self Care | Payer: Medicaid Other | Attending: Obstetrics and Gynecology | Admitting: Obstetrics and Gynecology

## 2016-11-16 DIAGNOSIS — O99353 Diseases of the nervous system complicating pregnancy, third trimester: Secondary | ICD-10-CM | POA: Diagnosis not present

## 2016-11-16 DIAGNOSIS — G5603 Carpal tunnel syndrome, bilateral upper limbs: Secondary | ICD-10-CM | POA: Insufficient documentation

## 2016-11-16 DIAGNOSIS — Z3A35 35 weeks gestation of pregnancy: Secondary | ICD-10-CM | POA: Diagnosis not present

## 2016-11-16 DIAGNOSIS — R200 Anesthesia of skin: Secondary | ICD-10-CM | POA: Diagnosis present

## 2016-11-16 LAB — PROTEIN / CREATININE RATIO, URINE
CREATININE, URINE: 60 mg/dL
Protein Creatinine Ratio: 0.12 mg/mg{Cre} (ref 0.00–0.15)
Total Protein, Urine: 7 mg/dL

## 2016-11-16 LAB — FETAL FIBRONECTIN: FETAL FIBRONECTIN: NEGATIVE

## 2016-11-16 NOTE — OB Triage Note (Signed)
Pt presents to triage, complains of numbness and tingling from fingertips to her wrists with swollen hands has been going on for two weeks progressively getting worse. She has been taking tylenol at home with no relief. Feet swelling last night and today, she also reports spots in her vision, but no headache. She also reports decreased fetal movement.

## 2016-11-16 NOTE — OB Triage Provider Note (Signed)
TRIAGE VISIT with NST   Michelle Norman is a 26 y.o. G2P1001. She is at 6685w2d gestation.  Indication: Bilateral hand and wrist numbness with visual spots x1 week.  S: Resting comfortably. no CTX, no VB. Active fetal movement. No headache. +bilateral LE swelling. Is a typist all day. O:  BP 118/70 (BP Location: Left Arm)   Pulse 89   Temp 98.2 F (36.8 C) (Oral)   Resp 16   LMP 03/14/2016 (Exact Date)  Results for orders placed or performed during the hospital encounter of 11/16/16 (from the past 48 hour(s))  Protein / creatinine ratio, urine   Collection Time: 11/16/16  5:33 PM  Result Value Ref Range   Creatinine, Urine 60 mg/dL   Total Protein, Urine 7 mg/dL   Protein Creatinine Ratio 0.12 0.00 - 0.15 mg/mg[Cre]  Fetal fibronectin   Collection Time: 11/16/16  6:23 PM  Result Value Ref Range   Fetal Fibronectin NEGATIVE NEGATIVE   Appearance, FETFIB CLEAR CLEAR     Gen: NAD, AAOx3      Abd: FNTTP      Ext: Non-tender, Nonedmeatous    FHT: 130, mod var, +accels, no decels TOCO: quiet SVE: Dilation: Closed Exam by:: j. maricle    A/P:  26 y.o. G2P1001 6785w2d with bilateral carpal tunnel, no evidence of PreE or gHTN.   Labor: not present.   Ortho wrist splints or sports med consult recommended. Sleep with arms off bed. Legs elevated at night. Compression and ice. Reassurance given.  Fetal Wellbeing: NST is Reassuring Cat 1 tracing.  D/c home stable, precautions reviewed, follow-up as scheduled.

## 2016-11-21 ENCOUNTER — Encounter: Payer: Self-pay | Admitting: Obstetrics

## 2016-11-21 ENCOUNTER — Ambulatory Visit (INDEPENDENT_AMBULATORY_CARE_PROVIDER_SITE_OTHER): Payer: Medicaid Other | Admitting: Obstetrics

## 2016-11-21 VITALS — BP 139/86 | HR 96 | Wt 178.8 lb

## 2016-11-21 DIAGNOSIS — Z3483 Encounter for supervision of other normal pregnancy, third trimester: Secondary | ICD-10-CM

## 2016-11-21 DIAGNOSIS — Z3480 Encounter for supervision of other normal pregnancy, unspecified trimester: Secondary | ICD-10-CM

## 2016-11-21 NOTE — Progress Notes (Signed)
Subjective:  Michelle Norman is a 26 y.o. G2P1001 at 723w0d being seen today for ongoing prenatal care.  She is currently monitored for the following issues for this low-risk pregnancy and has Supervision of other normal pregnancy, antepartum and Late prenatal care affecting pregnancy in second trimester on her problem list.  Patient reports carpal tunnel symptoms.  Contractions: Not present. Vag. Bleeding: None.  Movement: Present. Denies leaking of fluid.   The following portions of the patient's history were reviewed and updated as appropriate: allergies, current medications, past family history, past medical history, past social history, past surgical history and problem list. Problem list updated.  Objective:   Vitals:   11/21/16 0848  BP: 139/86  Pulse: 96  Weight: 178 lb 12.8 oz (81.1 kg)    Fetal Status:     Movement: Present     General:  Alert, oriented and cooperative. Patient is in no acute distress.  Skin: Skin is warm and dry. No rash noted.   Cardiovascular: Normal heart rate noted  Respiratory: Normal respiratory effort, no problems with respiration noted  Abdomen: Soft, gravid, appropriate for gestational age. Pain/Pressure: Absent     Pelvic:  Cervical exam deferred        Extremities: Normal range of motion.  Edema: Trace  Mental Status: Normal mood and affect. Normal behavior. Normal judgment and thought content.   Urinalysis: Urine Protein: Negative Urine Glucose: Trace  Assessment and Plan:  Pregnancy: G2P1001 at 293w0d  1. Supervision of other normal pregnancy, antepartum Labs: - Strep Gp B NAA 2. Carpal Tunnel Syndrome - bilateral    - Wrist splints Rx  Preterm labor symptoms and general obstetric precautions including but not limited to vaginal bleeding, contractions, leaking of fluid and fetal movement were reviewed in detail with the patient. Please refer to After Visit Summary for other counseling recommendations.  No Follow-up on file.   Brock Badharles A  Bell Carbo, MDPatient ID: Michelle ListenAbria Muriel, female   DOB: [birth year], 26 y.o.   MRN: [id number]

## 2016-11-22 LAB — OB RESULTS CONSOLE GBS: GBS: POSITIVE

## 2016-11-23 LAB — STREP GP B NAA: STREP GROUP B AG: POSITIVE — AB

## 2016-11-27 ENCOUNTER — Encounter: Payer: Self-pay | Admitting: Obstetrics

## 2016-11-27 ENCOUNTER — Ambulatory Visit (INDEPENDENT_AMBULATORY_CARE_PROVIDER_SITE_OTHER): Payer: Medicaid Other | Admitting: Obstetrics

## 2016-11-27 DIAGNOSIS — Z3483 Encounter for supervision of other normal pregnancy, third trimester: Secondary | ICD-10-CM

## 2016-11-27 DIAGNOSIS — Z3480 Encounter for supervision of other normal pregnancy, unspecified trimester: Secondary | ICD-10-CM

## 2016-11-27 NOTE — Progress Notes (Signed)
Subjective:  Michelle Norman is a 26 y.o. G2P1001 at 6656w6d being seen today for ongoing prenatal care.  She is currently monitored for the following issues for this low-risk pregnancy and has Supervision of other normal pregnancy, antepartum and Late prenatal care affecting pregnancy in second trimester on her problem list.  Patient reports carpal tunnel symptoms.  Contractions: Irregular. Vag. Bleeding: None.  Movement: Present. Denies leaking of fluid.   The following portions of the patient's history were reviewed and updated as appropriate: allergies, current medications, past family history, past medical history, past social history, past surgical history and problem list. Problem list updated.  Objective:   Vitals:   11/27/16 0952  BP: 128/87  Pulse: 96  Weight: 175 lb (79.4 kg)    Fetal Status: Fetal Heart Rate (bpm): 150   Movement: Present     General:  Alert, oriented and cooperative. Patient is in no acute distress.  Skin: Skin is warm and dry. No rash noted.   Cardiovascular: Normal heart rate noted  Respiratory: Normal respiratory effort, no problems with respiration noted  Abdomen: Soft, gravid, appropriate for gestational age. Pain/Pressure: Absent     Pelvic:  Cervical exam deferred        Extremities: Normal range of motion.  Edema: Trace  Mental Status: Normal mood and affect. Normal behavior. Normal judgment and thought content.   Urinalysis:      Assessment and Plan:  Pregnancy: G2P1001 at 4956w6d  1. Supervision of other normal pregnancy, antepartum   Term labor symptoms and general obstetric precautions including but not limited to vaginal bleeding, contractions, leaking of fluid and fetal movement were reviewed in detail with the patient. Please refer to After Visit Summary for other counseling recommendations.  No Follow-up on file.   Brock Badharles A Shalisha Clausing, MDPatient ID: Michelle Norman, female   DOB: Oct 30, 1990, 26 y.o.   MRN: [id number]

## 2016-12-03 ENCOUNTER — Encounter: Payer: Self-pay | Admitting: *Deleted

## 2016-12-03 ENCOUNTER — Ambulatory Visit (INDEPENDENT_AMBULATORY_CARE_PROVIDER_SITE_OTHER): Payer: Medicaid Other | Admitting: Certified Nurse Midwife

## 2016-12-03 VITALS — BP 134/88 | HR 88 | Wt 181.0 lb

## 2016-12-03 DIAGNOSIS — O99820 Streptococcus B carrier state complicating pregnancy: Secondary | ICD-10-CM | POA: Insufficient documentation

## 2016-12-03 DIAGNOSIS — Z3480 Encounter for supervision of other normal pregnancy, unspecified trimester: Secondary | ICD-10-CM

## 2016-12-03 DIAGNOSIS — O0932 Supervision of pregnancy with insufficient antenatal care, second trimester: Secondary | ICD-10-CM

## 2016-12-03 NOTE — Patient Instructions (Addendum)
Third Trimester of Pregnancy The third trimester is from week 29 through week 40 (months 7 through 9). The third trimester is a time when the unborn baby (fetus) is growing rapidly. At the end of the ninth month, the fetus is about 20 inches in length and weighs 6-10 pounds. Body changes during your third trimester Your body goes through many changes during pregnancy. The changes vary from woman to woman. During the third trimester:  Your weight will continue to increase. You can expect to gain 25-35 pounds (11-16 kg) by the end of the pregnancy.  You may begin to get stretch marks on your hips, abdomen, and breasts.  You may urinate more often because the fetus is moving lower into your pelvis and pressing on your bladder.  You may develop or continue to have heartburn. This is caused by increased hormones that slow down muscles in the digestive tract.  You may develop or continue to have constipation because increased hormones slow digestion and cause the muscles that push waste through your intestines to relax.  You may develop hemorrhoids. These are swollen veins (varicose veins) in the rectum that can itch or be painful.  You may develop swollen, bulging veins (varicose veins) in your legs.  You may have increased body aches in the pelvis, back, or thighs. This is due to weight gain and increased hormones that are relaxing your joints.  You may have changes in your hair. These can include thickening of your hair, rapid growth, and changes in texture. Some women also have hair loss during or after pregnancy, or hair that feels dry or thin. Your hair will most likely return to normal after your baby is born.  Your breasts will continue to grow and they will continue to become tender. A yellow fluid (colostrum) may leak from your breasts. This is the first milk you are producing for your baby.  Your belly button may stick out.  You may notice more swelling in your hands, face, or  ankles.  You may have increased tingling or numbness in your hands, arms, and legs. The skin on your belly may also feel numb.  You may feel short of breath because of your expanding uterus.  You may have more problems sleeping. This can be caused by the size of your belly, increased need to urinate, and an increase in your body's metabolism.  You may notice the fetus "dropping," or moving lower in your abdomen.  You may have increased vaginal discharge.  Your cervix becomes thin and soft (effaced) near your due date. What to expect at prenatal visits You will have prenatal exams every 2 weeks until week 36. Then you will have weekly prenatal exams. During a routine prenatal visit:  You will be weighed to make sure you and the fetus are growing normally.  Your blood pressure will be taken.  Your abdomen will be measured to track your baby's growth.  The fetal heartbeat will be listened to.  Any test results from the previous visit will be discussed.  You may have a cervical check near your due date to see if you have effaced. At around 36 weeks, your health care provider will check your cervix. At the same time, your health care provider will also perform a test on the secretions of the vaginal tissue. This test is to determine if a type of bacteria, Group B streptococcus, is present. Your health care provider will explain this further. Your health care provider may ask you:    What your birth plan is.  How you are feeling.  If you are feeling the baby move.  If you have had any abnormal symptoms, such as leaking fluid, bleeding, severe headaches, or abdominal cramping.  If you are using any tobacco products, including cigarettes, chewing tobacco, and electronic cigarettes.  If you have any questions. Other tests or screenings that may be performed during your third trimester include:  Blood tests that check for low iron levels (anemia).  Fetal testing to check the health,  activity level, and growth of the fetus. Testing is done if you have certain medical conditions or if there are problems during the pregnancy.  Nonstress test (NST). This test checks the health of your baby to make sure there are no signs of problems, such as the baby not getting enough oxygen. During this test, a belt is placed around your belly. The baby is made to move, and its heart rate is monitored during movement. What is false labor? False labor is a condition in which you feel small, irregular tightenings of the muscles in the womb (contractions) that eventually go away. These are called Braxton Hicks contractions. Contractions may last for hours, days, or even weeks before true labor sets in. If contractions come at regular intervals, become more frequent, increase in intensity, or become painful, you should see your health care provider. What are the signs of labor?  Abdominal cramps.  Regular contractions that start at 10 minutes apart and become stronger and more frequent with time.  Contractions that start on the top of the uterus and spread down to the lower abdomen and back.  Increased pelvic pressure and dull back pain.  A watery or bloody mucus discharge that comes from the vagina.  Leaking of amniotic fluid. This is also known as your "water breaking." It could be a slow trickle or a gush. Let your doctor know if it has a color or strange odor. If you have any of these signs, call your health care provider right away, even if it is before your due date. Follow these instructions at home: Eating and drinking  Continue to eat regular, healthy meals.  Do not eat:  Raw meat or meat spreads.  Unpasteurized milk or cheese.  Unpasteurized juice.  Store-made salad.  Refrigerated smoked seafood.  Hot dogs or deli meat, unless they are piping hot.  More than 6 ounces of albacore tuna a week.  Shark, swordfish, king mackerel, or tile fish.  Store-made salads.  Raw  sprouts, such as mung bean or alfalfa sprouts.  Take prenatal vitamins as told by your health care provider.  Take 1000 mg of calcium daily as told by your health care provider.  If you develop constipation:  Take over-the-counter or prescription medicines.  Drink enough fluid to keep your urine clear or pale yellow.  Eat foods that are high in fiber, such as fresh fruits and vegetables, whole grains, and beans.  Limit foods that are high in fat and processed sugars, such as fried and sweet foods. Activity  Exercise only as directed by your health care provider. Healthy pregnant women should aim for 2 hours and 30 minutes of moderate exercise per week. If you experience any pain or discomfort while exercising, stop.  Avoid heavy lifting.  Do not exercise in extreme heat or humidity, or at high altitudes.  Wear low-heel, comfortable shoes.  Practice good posture.  Do not travel far distances unless it is absolutely necessary and only with the approval   of your health care provider.  Wear your seat belt at all times while in a car, on a bus, or on a plane.  Take frequent breaks and rest with your legs elevated if you have leg cramps or low back pain.  Do not use hot tubs, steam rooms, or saunas.  You may continue to have sex unless your health care provider tells you otherwise. Lifestyle  Do not use any products that contain nicotine or tobacco, such as cigarettes and e-cigarettes. If you need help quitting, ask your health care provider.  Do not drink alcohol.  Do not use any medicinal herbs or unprescribed drugs. These chemicals affect the formation and growth of the baby.  If you develop varicose veins:  Wear support pantyhose or compression stockings as told by your healthcare provider.  Elevate your feet for 15 minutes, 3-4 times a day.  Wear a supportive maternity bra to help with breast tenderness. General instructions  Take over-the-counter and prescription  medicines only as told by your health care provider. There are medicines that are either safe or unsafe to take during pregnancy.  Take warm sitz baths to soothe any pain or discomfort caused by hemorrhoids. Use hemorrhoid cream or witch hazel if your health care provider approves.  Avoid cat litter boxes and soil used by cats. These carry germs that can cause birth defects in the baby. If you have a cat, ask someone to clean the litter box for you.  To prepare for the arrival of your baby:  Take prenatal classes to understand, practice, and ask questions about the labor and delivery.  Make a trial run to the hospital.  Visit the hospital and tour the maternity area.  Arrange for maternity or paternity leave through employers.  Arrange for family and friends to take care of pets while you are in the hospital.  Purchase a rear-facing car seat and make sure you know how to install it in your car.  Pack your hospital bag.  Prepare the baby's nursery. Make sure to remove all pillows and stuffed animals from the baby's crib to prevent suffocation.  Visit your dentist if you have not gone during your pregnancy. Use a soft toothbrush to brush your teeth and be gentle when you floss.  Keep all prenatal follow-up visits as told by your health care provider. This is important. Contact a health care provider if:  You are unsure if you are in labor or if your water has broken.  You become dizzy.  You have mild pelvic cramps, pelvic pressure, or nagging pain in your abdominal area.  You have lower back pain.  You have persistent nausea, vomiting, or diarrhea.  You have an unusual or bad smelling vaginal discharge.  You have pain when you urinate. Get help right away if:  You have a fever.  You are leaking fluid from your vagina.  You have spotting or bleeding from your vagina.  You have severe abdominal pain or cramping.  You have rapid weight loss or weight gain.  You have  shortness of breath with chest pain.  You notice sudden or extreme swelling of your face, hands, ankles, feet, or legs.  Your baby makes fewer than 10 movements in 2 hours.  You have severe headaches that do not go away with medicine.  You have vision changes. Summary  The third trimester is from week 29 through week 40, months 7 through 9. The third trimester is a time when the unborn baby (fetus)   is growing rapidly.  During the third trimester, your discomfort may increase as you and your baby continue to gain weight. You may have abdominal, leg, and back pain, sleeping problems, and an increased need to urinate.  During the third trimester your breasts will keep growing and they will continue to become tender. A yellow fluid (colostrum) may leak from your breasts. This is the first milk you are producing for your baby.  False labor is a condition in which you feel small, irregular tightenings of the muscles in the womb (contractions) that eventually go away. These are called Braxton Hicks contractions. Contractions may last for hours, days, or even weeks before true labor sets in.  Signs of labor can include: abdominal cramps; regular contractions that start at 10 minutes apart and become stronger and more frequent with time; watery or bloody mucus discharge that comes from the vagina; increased pelvic pressure and dull back pain; and leaking of amniotic fluid. This information is not intended to replace advice given to you by your health care provider. Make sure you discuss any questions you have with your health care provider. Document Released: 10/02/2001 Document Revised: 03/15/2016 Document Reviewed: 12/09/2012 Elsevier Interactive Patient Education  2017 [employer]  

## 2016-12-03 NOTE — Progress Notes (Signed)
   PRENATAL VISIT NOTE  Subjective:  Michelle Norman is a 26 y.o. G2P1001 at 1636w5d being seen today for ongoing prenatal care.  She is currently monitored for the following issues for this low-risk pregnancy and has Supervision of other normal pregnancy, antepartum; Late prenatal care affecting pregnancy in second trimester; and GBS (group B Streptococcus carrier), +RV culture, currently pregnant on her problem list.  Patient reports no complaints.  Contractions: Not present. Vag. Bleeding: None.  Movement: Present. Denies leaking of fluid.   The following portions of the patient's history were reviewed and updated as appropriate: allergies, current medications, past family history, past medical history, past social history, past surgical history and problem list. Problem list updated.  Objective:   Vitals:   12/03/16 1517  BP: 134/88  Pulse: 88  Weight: 181 lb (82.1 kg)    Fetal Status: Fetal Heart Rate (bpm): 135 Fundal Height: 39 cm Movement: Present  Presentation: Vertex  General:  Alert, oriented and cooperative. Patient is in no acute distress.  Skin: Skin is warm and dry. No rash noted.   Cardiovascular: Normal heart rate noted  Respiratory: Normal respiratory effort, no problems with respiration noted  Abdomen: Soft, gravid, appropriate for gestational age. Pain/Pressure: Absent     Pelvic:  Cervical exam performed Dilation: Closed Effacement (%): 0 Station: -3  Extremities: Normal range of motion.     Mental Status: Normal mood and affect. Normal behavior. Normal judgment and thought content.   Assessment and Plan:  Pregnancy: G2P1001 at 5936w5d  1. Supervision of other normal pregnancy, antepartum      Doing well.    2. Late prenatal care affecting pregnancy in second trimester     Started care @18  weeks  3. GBS (group B Streptococcus carrier), +RV culture, currently pregnant    PCN in labor  Term labor symptoms and general obstetric precautions including but not  limited to vaginal bleeding, contractions, leaking of fluid and fetal movement were reviewed in detail with the patient. Please refer to After Visit Summary for other counseling recommendations.  Return in about 1 week (around 12/10/2016) for ROB.   Roe Coombsachelle A Shon Indelicato, CNM

## 2016-12-10 ENCOUNTER — Ambulatory Visit (INDEPENDENT_AMBULATORY_CARE_PROVIDER_SITE_OTHER): Payer: Medicaid Other | Admitting: Certified Nurse Midwife

## 2016-12-10 VITALS — BP 131/90 | HR 90 | Wt 177.0 lb

## 2016-12-10 DIAGNOSIS — O163 Unspecified maternal hypertension, third trimester: Secondary | ICD-10-CM

## 2016-12-10 DIAGNOSIS — Z3480 Encounter for supervision of other normal pregnancy, unspecified trimester: Secondary | ICD-10-CM

## 2016-12-10 DIAGNOSIS — O99820 Streptococcus B carrier state complicating pregnancy: Secondary | ICD-10-CM

## 2016-12-10 DIAGNOSIS — O133 Gestational [pregnancy-induced] hypertension without significant proteinuria, third trimester: Secondary | ICD-10-CM

## 2016-12-10 DIAGNOSIS — O0932 Supervision of pregnancy with insufficient antenatal care, second trimester: Secondary | ICD-10-CM

## 2016-12-10 DIAGNOSIS — O0933 Supervision of pregnancy with insufficient antenatal care, third trimester: Secondary | ICD-10-CM

## 2016-12-10 NOTE — Progress Notes (Signed)
   PRENATAL VISIT NOTE  Subjective:  Michelle Norman is a 26 y.o. G2P1001 at 8729w5d being seen today for ongoing prenatal care.  She is currently monitored for the following issues for this low-risk pregnancy and has Supervision of other normal pregnancy, antepartum; Late prenatal care affecting pregnancy in second trimester; and GBS (group B Streptococcus carrier), +RV culture, currently pregnant on her problem list.  Patient reports backache, no bleeding, no cramping, no leaking and occasional contractions.  Contractions: Irregular. Vag. Bleeding: None.  Movement: Present. Denies leaking of fluid.   The following portions of the patient's history were reviewed and updated as appropriate: allergies, current medications, past family history, past medical history, past social history, past surgical history and problem list. Problem list updated.  Objective:   Vitals:   12/10/16 1557  BP: 131/90  Pulse: 90  Weight: 177 lb (80.3 kg)    Fetal Status: Fetal Heart Rate (bpm): 123 Fundal Height: 40 cm Movement: Present  Presentation: Vertex  General:  Alert, oriented and cooperative. Patient is in no acute distress.  Skin: Skin is warm and dry. No rash noted.   Cardiovascular: Normal heart rate noted  Respiratory: Normal respiratory effort, no problems with respiration noted  Abdomen: Soft, gravid, appropriate for gestational age. Pain/Pressure: Absent     Pelvic:  Cervical exam performed Dilation: Closed Effacement (%): 0 Station: Ballotable, asynclitic  Extremities: Normal range of motion.     Mental Status: Normal mood and affect. Normal behavior. Normal judgment and thought content.   Assessment and Plan:  Pregnancy: G2P1001 at 6529w5d  1. Supervision of other normal pregnancy, antepartum       2. Late prenatal care affecting pregnancy in second trimester     @18  weeks  3. GBS (group B Streptococcus carrier), +RV culture, currently pregnant     PCN for labor  4. Elevated blood  pressure affecting pregnancy in third trimester, antepartum      Repeat blood pressure check in 2 days.  Ate tacos last night, probable slight elevation in blood pressure.  Denies HA, floaters/change in vision or epigastric pain.   - Lactate dehydrogenase - Creatinine, serum - CBC - ALT - AST - Pathologist smear review - Protein / creatinine ratio, urine  Term labor symptoms and general obstetric precautions including but not limited to vaginal bleeding, contractions, leaking of fluid and fetal movement were reviewed in detail with the patient. Please refer to After Visit Summary for other counseling recommendations.  Return in about 1 week (around 12/17/2016) for ROB, days blood pressure check.   Roe Coombsachelle A Danyal Whitenack, CNM

## 2016-12-11 ENCOUNTER — Other Ambulatory Visit: Payer: Self-pay | Admitting: Certified Nurse Midwife

## 2016-12-11 LAB — CREATININE, SERUM
Creatinine, Ser: 0.65 mg/dL (ref 0.57–1.00)
GFR calc Af Amer: 143 mL/min/{1.73_m2} (ref >59)
GFR calc non Af Amer: 124 mL/min/{1.73_m2} (ref >59)

## 2016-12-11 LAB — CBC
HEMATOCRIT: 35 % (ref 34.0–46.6)
Hemoglobin: 11.6 g/dL (ref 11.1–15.9)
MCH: 31.4 pg (ref 26.6–33.0)
MCHC: 33.1 g/dL (ref 31.5–35.7)
MCV: 95 fL (ref 79–97)
Platelets: 224 10*3/uL (ref 150–379)
RBC: 3.7 x10E6/uL — ABNORMAL LOW (ref 3.77–5.28)
RDW: 13.5 % (ref 12.3–15.4)
WBC: 5.2 10*3/uL (ref 3.4–10.8)

## 2016-12-11 LAB — PROTEIN / CREATININE RATIO, URINE
CREATININE, UR: 102.8 mg/dL
PROTEIN UR: 27.3 mg/dL
Protein/Creat Ratio: 266 — ABNORMAL HIGH (ref 0–200)

## 2016-12-11 LAB — LACTATE DEHYDROGENASE: LDH: 292 IU/L — AB (ref 119–226)

## 2016-12-11 LAB — ALT: ALT: 10 IU/L (ref 0–32)

## 2016-12-11 LAB — AST: AST: 19 IU/L (ref 0–40)

## 2016-12-12 ENCOUNTER — Ambulatory Visit: Payer: Medicaid Other

## 2016-12-12 DIAGNOSIS — R030 Elevated blood-pressure reading, without diagnosis of hypertension: Secondary | ICD-10-CM

## 2016-12-12 LAB — PATHOLOGIST SMEAR REVIEW
BASOS ABS: 0 10*3/uL (ref 0.0–0.2)
Basos: 0 %
EOS (ABSOLUTE): 0 10*3/uL (ref 0.0–0.4)
EOS: 1 %
HEMATOCRIT: 35.1 % (ref 34.0–46.6)
HEMOGLOBIN: 11.7 g/dL (ref 11.1–15.9)
IMMATURE GRANULOCYTES: 0 %
Immature Grans (Abs): 0 10*3/uL (ref 0.0–0.1)
LYMPHS ABS: 1.2 10*3/uL (ref 0.7–3.1)
Lymphs: 24 %
MCH: 31.8 pg (ref 26.6–33.0)
MCHC: 33.3 g/dL (ref 31.5–35.7)
MCV: 95 fL (ref 79–97)
MONOS ABS: 0.4 10*3/uL (ref 0.1–0.9)
Monocytes: 8 %
NEUTROS PCT: 67 %
Neutrophils Absolute: 3.5 10*3/uL (ref 1.4–7.0)
PATH REV RBC: NORMAL
PATH REV WBC: NORMAL
PLATELETS: 229 10*3/uL (ref 150–379)
Path Rev PLTs: NORMAL
RBC: 3.68 x10E6/uL — ABNORMAL LOW (ref 3.77–5.28)
RDW: 13.4 % (ref 12.3–15.4)
WBC: 5.1 10*3/uL (ref 3.4–10.8)

## 2016-12-12 NOTE — Progress Notes (Signed)
Nurse visit for BP check only. PCR slightly elevated. 24 hr given and explained to pt in detail.

## 2016-12-14 ENCOUNTER — Other Ambulatory Visit: Payer: Medicaid Other

## 2016-12-14 ENCOUNTER — Telehealth: Payer: Self-pay

## 2016-12-14 NOTE — Telephone Encounter (Signed)
Patient left vm stating that she was not able to do her 24hr urine due to her work schedule, but she is scheduled for appt on Mon 12-17-16 and wanted to know if she can start Sunday morning. Returned call and left vm advising that lab stated that this is ok.

## 2016-12-17 ENCOUNTER — Inpatient Hospital Stay (HOSPITAL_COMMUNITY)
Admission: AD | Admit: 2016-12-17 | Discharge: 2016-12-19 | DRG: 775 | Disposition: A | Discharge status: Home / Self Care | Payer: Medicaid Other | Source: Ambulatory Visit | Attending: Obstetrics and Gynecology | Admitting: Obstetrics and Gynecology

## 2016-12-17 ENCOUNTER — Encounter (HOSPITAL_COMMUNITY): Payer: Self-pay

## 2016-12-17 ENCOUNTER — Ambulatory Visit (INDEPENDENT_AMBULATORY_CARE_PROVIDER_SITE_OTHER): Payer: Medicaid Other | Admitting: Certified Nurse Midwife

## 2016-12-17 VITALS — BP 138/93 | HR 85 | Wt 184.0 lb

## 2016-12-17 DIAGNOSIS — O1403 Mild to moderate pre-eclampsia, third trimester: Secondary | ICD-10-CM

## 2016-12-17 DIAGNOSIS — O135 Gestational [pregnancy-induced] hypertension without significant proteinuria, complicating the puerperium: Secondary | ICD-10-CM | POA: Diagnosis not present

## 2016-12-17 DIAGNOSIS — O99824 Streptococcus B carrier state complicating childbirth: Secondary | ICD-10-CM | POA: Diagnosis present

## 2016-12-17 DIAGNOSIS — O134 Gestational [pregnancy-induced] hypertension without significant proteinuria, complicating childbirth: Principal | ICD-10-CM | POA: Diagnosis present

## 2016-12-17 DIAGNOSIS — O0933 Supervision of pregnancy with insufficient antenatal care, third trimester: Secondary | ICD-10-CM

## 2016-12-17 DIAGNOSIS — O1400 Mild to moderate pre-eclampsia, unspecified trimester: Secondary | ICD-10-CM | POA: Insufficient documentation

## 2016-12-17 DIAGNOSIS — O99820 Streptococcus B carrier state complicating pregnancy: Secondary | ICD-10-CM

## 2016-12-17 DIAGNOSIS — Z3A39 39 weeks gestation of pregnancy: Secondary | ICD-10-CM

## 2016-12-17 DIAGNOSIS — O0932 Supervision of pregnancy with insufficient antenatal care, second trimester: Secondary | ICD-10-CM

## 2016-12-17 DIAGNOSIS — Z3480 Encounter for supervision of other normal pregnancy, unspecified trimester: Secondary | ICD-10-CM

## 2016-12-17 DIAGNOSIS — O139 Gestational [pregnancy-induced] hypertension without significant proteinuria, unspecified trimester: Secondary | ICD-10-CM | POA: Diagnosis present

## 2016-12-17 LAB — COMPREHENSIVE METABOLIC PANEL
ALBUMIN: 2.8 g/dL — AB (ref 3.5–5.0)
ALK PHOS: 274 U/L — AB (ref 38–126)
ALT: 12 U/L — ABNORMAL LOW (ref 14–54)
ANION GAP: 7 (ref 5–15)
AST: 20 U/L (ref 15–41)
BUN: 5 mg/dL — ABNORMAL LOW (ref 6–20)
CALCIUM: 8.7 mg/dL — AB (ref 8.9–10.3)
CHLORIDE: 108 mmol/L (ref 101–111)
CO2: 20 mmol/L — ABNORMAL LOW (ref 22–32)
Creatinine, Ser: 0.66 mg/dL (ref 0.44–1.00)
GFR calc Af Amer: >60 mL/min (ref >60)
GFR calc non Af Amer: >60 mL/min (ref >60)
GLUCOSE: 80 mg/dL (ref 65–99)
POTASSIUM: 3.8 mmol/L (ref 3.5–5.1)
SODIUM: 135 mmol/L (ref 135–145)
Total Bilirubin: 0.4 mg/dL (ref 0.3–1.2)
Total Protein: 6.1 g/dL — ABNORMAL LOW (ref 6.5–8.1)

## 2016-12-17 LAB — CBC
HCT: 31.4 % — ABNORMAL LOW (ref 36.0–46.0)
HEMOGLOBIN: 10.9 g/dL — AB (ref 12.0–15.0)
MCH: 32.5 pg (ref 26.0–34.0)
MCHC: 34.7 g/dL (ref 30.0–36.0)
MCV: 93.7 fL (ref 78.0–100.0)
Platelets: 184 10*3/uL (ref 150–400)
RBC: 3.35 MIL/uL — AB (ref 3.87–5.11)
RDW: 13 % (ref 11.5–15.5)
WBC: 5.1 10*3/uL (ref 4.0–10.5)

## 2016-12-17 LAB — ABO/RH: ABO/RH(D): A POS

## 2016-12-17 LAB — PROTEIN / CREATININE RATIO, URINE
Creatinine, Urine: 106 mg/dL
PROTEIN CREATININE RATIO: 0.19 mg/mg{creat} — AB (ref 0.00–0.15)
TOTAL PROTEIN, URINE: 20 mg/dL

## 2016-12-17 LAB — TYPE AND SCREEN
ABO/RH(D): A POS
ANTIBODY SCREEN: NEGATIVE

## 2016-12-17 MED ORDER — PENICILLIN G POTASSIUM 5000000 UNITS IJ SOLR
5.0000 10*6.[IU] | Freq: Once | INTRAVENOUS | Status: AC
  Administered 2016-12-17: 5 10*6.[IU] via INTRAVENOUS
  Filled 2016-12-17: qty 5

## 2016-12-17 MED ORDER — OXYTOCIN 40 UNITS IN LACTATED RINGERS INFUSION - SIMPLE MED
2.5000 [IU]/h | INTRAVENOUS | Status: DC
  Administered 2016-12-17: 2.5 [IU]/h via INTRAVENOUS

## 2016-12-17 MED ORDER — ONDANSETRON HCL 4 MG/2ML IJ SOLN: 4.0000 mg | Freq: Four times a day (QID) | INTRAMUSCULAR | Status: DC | PRN

## 2016-12-17 MED ORDER — TERBUTALINE SULFATE 1 MG/ML IJ SOLN
0.2500 mg | Freq: Once | INTRAMUSCULAR | Status: DC | PRN
  Filled 2016-12-17: qty 1

## 2016-12-17 MED ORDER — FLEET ENEMA 7-19 GM/118ML RE ENEM: 1.0000 | ENEMA | RECTAL | Status: DC | PRN

## 2016-12-17 MED ORDER — SOD CITRATE-CITRIC ACID 500-334 MG/5ML PO SOLN: 30.0000 mL | ORAL | Status: DC | PRN

## 2016-12-17 MED ORDER — FENTANYL CITRATE (PF) 100 MCG/2ML IJ SOLN
50.0000 ug | INTRAMUSCULAR | Status: DC | PRN
  Administered 2016-12-17 (×2): 100 ug via INTRAVENOUS
  Filled 2016-12-17 (×2): qty 2

## 2016-12-17 MED ORDER — OXYTOCIN 40 UNITS IN LACTATED RINGERS INFUSION - SIMPLE MED
1.0000 m[IU]/min | INTRAVENOUS | Status: DC
  Administered 2016-12-17: 2 m[IU]/min via INTRAVENOUS
  Filled 2016-12-17: qty 1000

## 2016-12-17 MED ORDER — PENICILLIN G POT IN DEXTROSE 60000 UNIT/ML IV SOLN
3.0000 10*6.[IU] | INTRAVENOUS | Status: DC
  Administered 2016-12-17 (×2): 3 10*6.[IU] via INTRAVENOUS
  Filled 2016-12-17 (×6): qty 50

## 2016-12-17 MED ORDER — ACETAMINOPHEN 325 MG PO TABS: 650.0000 mg | ORAL_TABLET | ORAL | Status: DC | PRN

## 2016-12-17 MED ORDER — FAMOTIDINE 20 MG PO TABS
20.0000 mg | ORAL_TABLET | Freq: Once | ORAL | Status: AC
  Administered 2016-12-17: 20 mg via ORAL
  Filled 2016-12-17: qty 1

## 2016-12-17 MED ORDER — MISOPROSTOL 25 MCG QUARTER TABLET
25.0000 ug | ORAL_TABLET | ORAL | Status: DC | PRN
  Administered 2016-12-17: 25 ug via VAGINAL
  Filled 2016-12-17: qty 1
  Filled 2016-12-17 (×2): qty 0.25

## 2016-12-17 MED ORDER — LACTATED RINGERS IV SOLN: 500.0000 mL | INTRAVENOUS | Status: DC | PRN

## 2016-12-17 MED ORDER — OXYCODONE-ACETAMINOPHEN 5-325 MG PO TABS: 2.0000 | ORAL_TABLET | ORAL | Status: DC | PRN

## 2016-12-17 MED ORDER — OXYCODONE-ACETAMINOPHEN 5-325 MG PO TABS: 1.0000 | ORAL_TABLET | ORAL | Status: DC | PRN

## 2016-12-17 MED ORDER — OXYTOCIN BOLUS FROM INFUSION
500.0000 mL | Freq: Once | INTRAVENOUS | Status: AC
  Administered 2016-12-17: 500 mL via INTRAVENOUS

## 2016-12-17 MED ORDER — LACTATED RINGERS IV SOLN
INTRAVENOUS | Status: DC
  Administered 2016-12-17: 125 mL/h via INTRAVENOUS
  Administered 2016-12-17: 12:00:00 via INTRAVENOUS

## 2016-12-17 MED ORDER — LIDOCAINE HCL (PF) 1 % IJ SOLN
30.0000 mL | INTRAMUSCULAR | Status: DC | PRN
  Administered 2016-12-17: 30 mL via SUBCUTANEOUS
  Filled 2016-12-17: qty 30

## 2016-12-17 NOTE — H&P (Signed)
LABOR AND DELIVERY ADMISSION HISTORY AND PHYSICAL NOTE  Michelle Norman is a 26 y.o. female G2P1001 with IUP at [redacted]w[redacted]d by LMP presenting for Elevated BP, found to have gestational hypertension.   Patient was seen this AM for follow up BP check after having mildly elevated BPs in office on 2/19 with negative PreE labs and no signs or symptoms. She is still asymptomatic today, but had elevated BP again in office. Was sent in to [facility], found to still have elevated BP, and therefore was admitted for IOL for gestational hypertension. She denies HAs, BV or changes in vision, RUQ/epigastric pain, SOB/CP.   She reports positive fetal movement. She denies leakage of fluid or vaginal bleeding.  Prenatal History/Complications:  Past Medical History: Past Medical History:  Diagnosis Date  . Medical history non-contributory     Past Surgical History: Past Surgical History:  Procedure Laterality Date  . NO PAST SURGERIES      Obstetrical History: OB History    Gravida Para Term Preterm AB Living   2 1 1  0 0 1   SAB TAB Ectopic Multiple Live Births   0 0 0 0 1      Social History: Social History   Social History  . Marital status: Single    Spouse name: N/A  . Number of children: N/A  . Years of education: N/A   Social History Main Topics  . Smoking status: Never Smoker  . Smokeless tobacco: Never Used  . Alcohol use No  . Drug use: No  . Sexual activity: Yes    Birth control/ protection: None   Other Topics Concern  . None   Social History Narrative  . None    Family History: Family History  Problem Relation Age of Onset  . Cancer Paternal Grandmother     Allergies: No Known Allergies  Prescriptions Prior to Admission  Medication Sig Dispense Refill Last Dose  . acetaminophen (TYLENOL) 500 MG tablet Take 500 mg by mouth every 6 (six) hours as needed for mild pain or headache.   12/16/2016 at Unknown time  . Prenat-FePoly-Metf-FA-DHA-DSS (VITAFOL FE+) 90-1-200 & 50 MG  CPPK Take 2 capsules by mouth daily. 60 each 11 12/16/2016 at Unknown time  . ranitidine (ZANTAC) 150 MG tablet Take 1 tablet (150 mg total) by mouth 2 (two) times daily. 60 tablet 5 12/17/2016 at Unknown time     Review of Systems   All systems reviewed and negative except as stated in HPI  Blood pressure 144/92, pulse 86, temperature 98.2 F (36.8 C), temperature source Oral, resp. rate 18, height 5\' 2"  (1.575 m), weight 184 lb (83.5 kg), last menstrual period 03/14/2016, SpO2 98 %. General appearance: alert, cooperative, appears stated age and no distress Lungs: clear to auscultation bilaterally Heart: regular rate and rhythm Abdomen: soft, non-tender; bowel sounds normal Extremities: No calf swelling or tenderness Presentation: cephalic Fetal monitoring: 130 bpm, mod var, +accels, no decels Uterine activity: q6-7 min     Prenatal labs: ABO, Rh: A/Positive/-- (09/28 1602) Antibody: Negative (09/28 1602) Rubella: !Error! RPR: Non Reactive (12/04 1105)  HBsAg: Negative (09/28 1602)  HIV: Non Reactive (12/04 1343)  GBS: Positive (02/01 0000)  2 hr Glucola: 88/114/136 Genetic screening:  NIPS Neg Anatomy US: Echogenic focus, renal normal; female  Prenatal Transfer Tool  Maternal Diabetes: No Genetic Screening: Normal Maternal Ultrasounds/Referrals: Abnormal:  Findings:   Isolated EIF (echogenic intracardiac focus) Fetal Ultrasounds or other Referrals:  None Maternal Substance Abuse:  No Significant Maternal Medications:  None Significant Maternal Lab Results: Lab values include: Group B Strep positive  Results for orders placed or performed during the hospital encounter of 12/17/16 (from the past 24 hour(s))  Protein / creatinine ratio, urine   Collection Time: 12/17/16 10:08 AM  Result Value Ref Range   Creatinine, Urine 106.00 mg/dL   Total Protein, Urine 20 mg/dL   Protein Creatinine Ratio 0.19 (H) 0.00 - 0.15 mg/mg[Cre]  CBC   Collection Time: 12/17/16 10:34 AM   Result Value Ref Range   WBC 5.1 4.0 - 10.5 K/uL   RBC 3.35 (L) 3.87 - 5.11 MIL/uL   Hemoglobin 10.9 (L) 12.0 - 15.0 g/dL   HCT 21.331.4 (L) 08.636.0 - 57.846.0 %   MCV 93.7 78.0 - 100.0 fL   MCH 32.5 26.0 - 34.0 pg   MCHC 34.7 30.0 - 36.0 g/dL   RDW 46.913.0 62.911.5 - 52.815.5 %   Platelets 184 150 - 400 K/uL  Comprehensive metabolic panel   Collection Time: 12/17/16 10:34 AM  Result Value Ref Range   Sodium 135 135 - 145 mmol/L   Potassium 3.8 3.5 - 5.1 mmol/L   Chloride 108 101 - 111 mmol/L   CO2 20 (L) 22 - 32 mmol/L   Glucose, Bld 80 65 - 99 mg/dL   BUN 5 (L) 6 - 20 mg/dL   Creatinine, Ser 4.130.66 0.44 - 1.00 mg/dL   Calcium 8.7 (L) 8.9 - 10.3 mg/dL   Total Protein 6.1 (L) 6.5 - 8.1 g/dL   Albumin 2.8 (L) 3.5 - 5.0 g/dL   AST 20 15 - 41 U/L   ALT 12 (L) 14 - 54 U/L   Alkaline Phosphatase 274 (H) 38 - 126 U/L   Total Bilirubin 0.4 0.3 - 1.2 mg/dL   GFR calc non Af Amer >60 >60 mL/min   GFR calc Af Amer >60 >60 mL/min   Anion gap 7 5 - 15    Patient Active Problem List   Diagnosis Date Noted  . Antepartum mild preeclampsia 12/17/2016  . Gestational hypertension 12/17/2016  . Elevated blood-pressure reading without diagnosis of hypertension 12/12/2016  . GBS (group B Streptococcus carrier), +RV culture, currently pregnant 12/03/2016  . Supervision of other normal pregnancy, antepartum 07/19/2016  . Late prenatal care affecting pregnancy in second trimester 07/19/2016    Assessment: Michelle Norman is a 26 y.o. G2P1001 at 8213w5d here for elevated BPs, found to have gestational hypertension, ruled out preeclampsia.   #Labor: IOL with FB and cytotec #Pain:  IV pain meds prn; does not want epidural but can have on request #FWB:  Cat I #ID:   GBS Pos - PCN #MOF:  Breast #MOC: Nexplanon #Circ:  outpatient  Michelle MowElizabeth Pluma Diniz, DO [facility] 12/17/2016, 11:58 AM

## 2016-12-17 NOTE — MAU Note (Addendum)
Sent over from clinic.  Has been elevated last 3 wks, steadily rising. Some increase in swelling- but 'kind of normal for her', denies HA, visual changes or epigastric pain. Had a 24 hrs urine sample she brought in to office and and labwork drawn.

## 2016-12-17 NOTE — Addendum Note (Signed)
Addended by: Marya LandryFOSTER, Dervin Vore D on: 12/17/2016 09:29 AM   Modules accepted: Orders

## 2016-12-17 NOTE — Addendum Note (Signed)
Addended by: Burnell BlanksMASHBURN, Tyeesha Riker on: 12/17/2016 09:34 AM   Modules accepted: Orders

## 2016-12-17 NOTE — Progress Notes (Signed)
Patient ID: Michelle Norman, female   DOB: [birth year], 26 y.o.   MRN: [id number]  S: Patient seen & examined for progress of labor. Patient comfortable with epidural.    O:  Vitals:   12/17/16 1143 12/17/16 1212 12/17/16 1300 12/17/16 1425  BP:  (!) 154/96  139/79  Pulse:  81  89  Resp:  18 18 18   Temp:      TempSrc:      SpO2:      Weight: 184 lb (83.5 kg)     Height: 5\' 2"  (1.575 m)       Dilation: 4 Effacement (%): 70 Station: -2 Presentation: Vertex Exam by:: Dr. Omer JackMumaw FB was sitting in vagina, out of cervix  FHT: 140 bpm, mod var, +accels, no decels TOCO: Irregular   A/P: FB out Pitocin to be started Continue expectant management Anticipate SVD

## 2016-12-17 NOTE — MAU Note (Signed)
Urine in lab 

## 2016-12-17 NOTE — Anesthesia Pain Management Evaluation Note (Signed)
  CRNA Pain Management Visit Note  Patient: Michelle Norman, 26 y.o., female  "Hello I am a member of the anesthesia team at [facility]. We have an anesthesia team available at all times to provide care throughout the hospital, including epidural management and anesthesia for C-section. I don't know your plan for the delivery whether it a natural birth, water birth, IV sedation, nitrous supplementation, doula or epidural, but we want to meet your pain goals."   1.Was your pain managed to your expectations on prior hospitalizations?   Yes   2.What is your expectation for pain management during this hospitalization?     Labor support without medications  3.How can we help you reach that goal? unsure  Record the patient's initial score and the patient's pain goal.   Pain: 5  Pain Goal: 10 [facility] wants you to be able to say your pain was always managed very well.  Michelle Norman,Michelle Norman 12/17/2016

## 2016-12-17 NOTE — Progress Notes (Signed)
Patient ID: Michelle Norman, female   DOB: 04-28-1991, 26 y.o.   MRN: [id number]  S: Patient seen & examined for progress of labor. Patient is getting uncomfortable, still does not want epidural. Called out that she felt a "pop" and "something coming out".    O:  Vitals:   12/17/16 1820 12/17/16 1830 12/17/16 1940 12/17/16 2028  BP: 128/86  (!) 144/95 135/84  Pulse: 89  90 73  Resp:  18 18 18   Temp:      TempSrc:      SpO2:      Weight:      Height:        Dilation: 9 Effacement (%): 90 Station: +1 Presentation: Vertex Exam by:: Dr. Omer JackMumaw  SROM with clear fluid.   FHT: 130 bpm, mod var, +accels, no decels TOCO: q2-783min   A/P: SROM Pitocin Continue expectant management Anticipate SVD

## 2016-12-17 NOTE — Progress Notes (Addendum)
   PRENATAL VISIT NOTE  Subjective:  Michelle Norman is a 26 y.o.Michelle Norman G2P1001 at 4583w5d being seen today for ongoing prenatal care.  She is currently monitored for the following issues for this low-risk pregnancy and has Supervision of other normal pregnancy, antepartum; Late prenatal care affecting pregnancy in second trimester; GBS (group B Streptococcus carrier), +RV culture, currently pregnant; Elevated blood-pressure reading without diagnosis of hypertension; and Antepartum mild preeclampsia on her problem list.  Patient reports backache, no bleeding, no cramping, no leaking and occasional contractions.  Contractions: Not present. Vag. Bleeding: None.  Movement: Present. Denies leaking of fluid.   The following portions of the patient's history were reviewed and updated as appropriate: allergies, current medications, past family history, past medical history, past social history, past surgical history and problem list. Problem list updated.  Objective:   Vitals:   12/17/16 0900  BP: (!) 138/93  Pulse: 85  Weight: 184 lb (83.5 kg)    Fetal Status: Fetal Heart Rate (bpm): 145 Fundal Height: 39 cm Movement: Present  Presentation: Vertex  General:  Alert, oriented and cooperative. Patient is in no acute distress.  Skin: Skin is warm and dry. No rash noted.   Cardiovascular: Normal heart rate noted  Respiratory: Normal respiratory effort, no problems with respiration noted  Abdomen: Soft, gravid, appropriate for gestational age. Pain/Pressure: Absent     Pelvic:  Cervical exam performed Dilation: 2 Effacement (%): 50 Station: -2  Extremities: Normal range of motion.     Mental Status: Normal mood and affect. Normal behavior. Normal judgment and thought content.   Assessment and Plan:  Pregnancy: G2P1001 at 3683w5d  1. GBS (group B Streptococcus carrier), +RV culture, currently pregnant     PCN for labor  2. Late prenatal care affecting pregnancy in second trimester      @18wks   3.  Supervision of other normal pregnancy, antepartum        4. Antepartum mild preeclampsia     Increased blood pressure readings X2: To MAU for evaluation. 24 hour urine in lab this AM.   Term labor symptoms and general obstetric precautions including but not limited to vaginal bleeding, contractions, leaking of fluid and fetal movement were reviewed in detail with the patient. Please refer to After Visit Summary for other counseling recommendations.  Return in about 1 week (around 12/24/2016) for ROB, NST: if not delivered.   Roe Coombsachelle A Suetta Hoffmeister, CNM

## 2016-12-17 NOTE — Progress Notes (Addendum)
Assumed care of pt at this time. Pt breathing thru ctx. SVE done @ 2225 by Dr. Wilford GristMumau 9/100/+1. SROM clear   2245: pt c/o increasing rectal pressures. SVE 10/100/+2.  2247:  MD notified for delivery  2257: Viable delivery of baby girl. APGAR 7/9   2307: Laceration 1st degree perineum repair in progress. 1% cocal lidocaine used to numb area.   2330: Assisted with breast feedng. Infant to breast w good latch.

## 2016-12-18 LAB — PROTEIN, URINE, 24 HOUR
Protein, 24H Urine: 221 mg/24 hr — ABNORMAL HIGH (ref 30–150)
Protein, Ur: 9.5 mg/dL

## 2016-12-18 LAB — CREATININE CLEARANCE, URINE, 24 HOUR
CREATININE: 0.66 mg/dL (ref 0.57–1.00)
Creatinine Clearance: 101 mL/min (ref 88–128)
Creatinine, 24H Ur: 958 mg/24 hr (ref 800–1800)
Creatinine, Urine: 41.2 mg/dL
GFR, EST AFRICAN AMERICAN: 142 mL/min/{1.73_m2} (ref >59)
GFR, EST NON AFRICAN AMERICAN: 123 mL/min/{1.73_m2} (ref >59)

## 2016-12-18 LAB — RPR: RPR: NONREACTIVE

## 2016-12-18 MED ORDER — SODIUM CHLORIDE 0.9 % IV SOLN: 250.0000 mL | INTRAVENOUS | Status: DC | PRN

## 2016-12-18 MED ORDER — IBUPROFEN 600 MG PO TABS
600.0000 mg | ORAL_TABLET | Freq: Four times a day (QID) | ORAL | Status: DC
  Administered 2016-12-18 – 2016-12-19 (×6): 600 mg via ORAL
  Filled 2016-12-18 (×5): qty 1

## 2016-12-18 MED ORDER — BENZOCAINE-MENTHOL 20-0.5 % EX AERO
1.0000 "application " | INHALATION_SPRAY | CUTANEOUS | Status: DC | PRN
  Filled 2016-12-18: qty 56

## 2016-12-18 MED ORDER — ONDANSETRON HCL 4 MG/2ML IJ SOLN: 4.0000 mg | INTRAMUSCULAR | Status: DC | PRN

## 2016-12-18 MED ORDER — SIMETHICONE 80 MG PO CHEW
80.0000 mg | CHEWABLE_TABLET | ORAL | Status: DC | PRN
Start: 2016-12-18 — End: 2016-12-19

## 2016-12-18 MED ORDER — SODIUM CHLORIDE 0.9% FLUSH: 3.0000 mL | INTRAVENOUS | Status: DC | PRN

## 2016-12-18 MED ORDER — SENNOSIDES-DOCUSATE SODIUM 8.6-50 MG PO TABS
2.0000 | ORAL_TABLET | ORAL | Status: DC
  Administered 2016-12-18 – 2016-12-19 (×2): 2 via ORAL
  Filled 2016-12-18 (×2): qty 2

## 2016-12-18 MED ORDER — PRENATAL MULTIVITAMIN CH
1.0000 | ORAL_TABLET | Freq: Every day | ORAL | Status: DC
  Administered 2016-12-18 – 2016-12-19 (×2): 1 via ORAL
  Filled 2016-12-18: qty 1

## 2016-12-18 MED ORDER — SODIUM CHLORIDE 0.9% FLUSH: 3.0000 mL | Freq: Two times a day (BID) | INTRAVENOUS | Status: DC

## 2016-12-18 MED ORDER — AMLODIPINE BESYLATE 5 MG PO TABS
5.0000 mg | ORAL_TABLET | Freq: Once | ORAL | Status: AC
  Administered 2016-12-18: 5 mg via ORAL
  Filled 2016-12-18: qty 1

## 2016-12-18 MED ORDER — ONDANSETRON HCL 4 MG PO TABS: 4.0000 mg | ORAL_TABLET | ORAL | Status: DC | PRN

## 2016-12-18 MED ORDER — OXYTOCIN 40 UNITS IN LACTATED RINGERS INFUSION - SIMPLE MED: 2.5000 [IU]/h | INTRAVENOUS | Status: DC | PRN

## 2016-12-18 MED ORDER — DIPHENHYDRAMINE HCL 25 MG PO CAPS: 25.0000 mg | ORAL_CAPSULE | Freq: Four times a day (QID) | ORAL | Status: DC | PRN

## 2016-12-18 MED ORDER — ZOLPIDEM TARTRATE 5 MG PO TABS: 5.0000 mg | ORAL_TABLET | Freq: Every evening | ORAL | Status: DC | PRN

## 2016-12-18 MED ORDER — PANTOPRAZOLE SODIUM 40 MG PO TBEC
40.0000 mg | DELAYED_RELEASE_TABLET | Freq: Once | ORAL | Status: AC
  Administered 2016-12-18: 40 mg via ORAL
  Filled 2016-12-18: qty 1

## 2016-12-18 MED ORDER — DIBUCAINE 1 % RE OINT: 1.0000 "application " | TOPICAL_OINTMENT | RECTAL | Status: DC | PRN

## 2016-12-18 MED ORDER — AMLODIPINE BESYLATE 10 MG PO TABS
10.0000 mg | ORAL_TABLET | Freq: Every day | ORAL | Status: DC
  Administered 2016-12-19: 10 mg via ORAL
  Filled 2016-12-18: qty 1

## 2016-12-18 MED ORDER — ACETAMINOPHEN 325 MG PO TABS
650.0000 mg | ORAL_TABLET | ORAL | Status: DC | PRN
  Administered 2016-12-18: 650 mg via ORAL
  Filled 2016-12-18: qty 2

## 2016-12-18 MED ORDER — TETANUS-DIPHTH-ACELL PERTUSSIS 5-2.5-18.5 LF-MCG/0.5 IM SUSP: 0.5000 mL | Freq: Once | INTRAMUSCULAR | Status: DC

## 2016-12-18 MED ORDER — AMLODIPINE BESYLATE 5 MG PO TABS
5.0000 mg | ORAL_TABLET | Freq: Every day | ORAL | Status: DC
  Administered 2016-12-18: 5 mg via ORAL
  Filled 2016-12-18 (×2): qty 1

## 2016-12-18 MED ORDER — COCONUT OIL OIL: 1.0000 "application " | TOPICAL_OIL | Status: DC | PRN

## 2016-12-18 MED ORDER — WITCH HAZEL-GLYCERIN EX PADS: 1.0000 "application " | MEDICATED_PAD | CUTANEOUS | Status: DC | PRN

## 2016-12-18 NOTE — Progress Notes (Signed)
Post Partum Day #1 Subjective: no complaints, up ad lib, voiding and tolerating PO  Objective: Blood pressure 140/80, pulse 75, temperature 98.5 F (36.9 C), temperature source Oral, resp. rate 16, height 5\' 2"  (1.575 m), weight 184 lb (83.5 kg), last menstrual period 03/14/2016, SpO2 98 %.  Physical Exam:  General: alert, cooperative and no distress Lochia: appropriate Uterine Fundus: firm Incision: 1st deg; healing DVT Evaluation: No evidence of DVT seen on physical exam. No cords or calf tenderness. No significant calf/ankle edema.   Recent Labs  12/17/16 1034  HGB 10.9*  HCT 31.4*    Assessment/Plan: Plan for discharge tomorrow and Contraception Nexplanon.  Elevated blood pressures: Norvasc started.    LOS: 1 day   Roe CoombsRachelle A Tyrese Ficek, CNM 12/18/2016, 7:32 AM

## 2016-12-18 NOTE — Progress Notes (Signed)
Dr. Earlene PlaterWallace called about b/p elevation. Orders received.

## 2016-12-18 NOTE — Lactation Note (Signed)
This note was copied from a baby's chart. Lactation Consultation Note Mom's 2nd child, 1st child now 26 yrs old but didn't BF.  Mom will be returning to work 6-8 weeks. Would like to build up storage supply before returning to work. Briefly discussed pumping one breast and BF the other, then alternate the next feeding.  Mom encouraged to feed baby 8-12 times/24 hours and with feeding cues. Educated STS, I&O, supply and demand. Mom has flat nipples, erect nipples when stimulated, very compressible. LC feels baby should be able to latch deeply. Encouraged to stimulate nipples prior to latching. Hand expression taught. No colostrum seen at this time. Call for assistance if needed. WH/LC brochure given w/resources, support groups and LC services.   Patient Name: Michelle Norman WUJWJ'XToday's Date: 12/18/2016 Reason for consult: Initial assessment   Maternal Data Has patient been taught Hand Expression?: Yes Does the patient have breastfeeding experience prior to this delivery?: No  Feeding Feeding Type: Breast Fed Length of feed: 30 min  LATCH Score/Interventions       Type of Nipple: Everted at rest and after stimulation  Comfort (Breast/Nipple): Soft / non-tender     Intervention(s): Breastfeeding basics reviewed;Skin to skin     Lactation Tools Discussed/Used     Consult Status Consult Status: Follow-up Date: 12/18/16 (in pm) Follow-up type: In-patient    Charyl DancerCARVER, Amori Colomb G 12/18/2016, 3:43 AM

## 2016-12-19 MED ORDER — AMLODIPINE BESYLATE 10 MG PO TABS
10.0000 mg | ORAL_TABLET | Freq: Every day | ORAL | 2 refills | Status: DC
Start: 2016-12-19 — End: 2022-08-07

## 2016-12-19 MED ORDER — IBUPROFEN 600 MG PO TABS: 600.0000 mg | ORAL_TABLET | Freq: Four times a day (QID) | ORAL | 2 refills | Status: DC

## 2016-12-19 NOTE — Lactation Note (Addendum)
This note was copied from a baby's chart. Lactation Consultation Note  Patient Name: Boy Eula Listenbria Alexa ZOXWR'UToday's Date: 12/19/2016 Reason for consult: Follow-up assessment  "Kassius" is nursing very well. Mom is able to identify swallows. Mom assisted w/getting a more asymmetric latch. Signs of satiety discussed.  Mom was interested in wearing shells, which I provided. Nipples noted to be atraumatic.   Mom has our phone # to call if she has any questions post-discharge. Mom reports having a pump at home.  Lurline HareRichey, Lailoni Baquera [facility] 12/19/2016, 10:43 AM

## 2016-12-19 NOTE — Discharge Summary (Signed)
OB Discharge Summary     Patient Name: Michelle Norman DOB: 1990/10/24 MRN: [id number]  Date of admission: 12/17/2016 Delivering MD: Jen MowMUMAW, [facility]   Date of discharge: 12/19/2016  Admitting diagnosis: BP Intrauterine pregnancy: 8851w0d     Secondary diagnosis:  Active Problems:   Gestational hypertension  Additional problems: none     Discharge diagnosis: Term Pregnancy Delivered and Gestational Hypertension                                                                                                Post partum procedures:none  Augmentation: AROM, Pitocin, Cytotec and Foley Balloon  Complications: None  Hospital course:  Induction of Labor With Vaginal Delivery   26 y.o. yo G2P1001 at 1851w0d was admitted to the hospital 12/17/2016 for induction of labor.  Indication for induction: Gestational hypertension.  Patient had an uncomplicated labor course as follows: Membrane Rupture Time/Date: 10:25 PM ,12/17/2016   Intrapartum Procedures: Episiotomy: None [1]                                         Lacerations:  1st degree [2];Perineal [11]  Patient had delivery of a Viable infant.  Information for the patient's newborn:  Redge GainerRoland, Boy Ahaana [[id number]][[id number]]  Delivery Method: Vaginal, Spontaneous Delivery (Filed from Delivery Summary)   12/17/2016  Details of delivery can be found in separate delivery note.  Patient had a routine postpartum course. Patient is discharged home 12/19/16.  Physical exam  Vitals:   12/18/16 1900 12/18/16 1923 12/18/16 2207 12/19/16 0710  BP: (!) 153/98 (!) 158/98 (!) 147/89 133/75  Pulse: 71  67 72  Resp: 18   18  Temp: 98.7 F (37.1 C)   98.1 F (36.7 C)  TempSrc: Oral   Oral  SpO2:      Weight:      Height:       General: alert, cooperative and no distress Lochia: appropriate Uterine Fundus: firm Incision: N/A DVT Evaluation: No evidence of DVT seen on physical exam. No cords or calf tenderness. No significant calf/ankle  edema. Labs: Lab Results  Component Value Date   WBC 5.1 12/17/2016   HGB 10.9 (L) 12/17/2016   HCT 31.4 (L) 12/17/2016   MCV 93.7 12/17/2016   PLT 184 12/17/2016   CMP Latest Ref Rng & Units 12/17/2016  Glucose 65 - 99 mg/dL -  BUN 6 - 20 mg/dL -  Creatinine 7.820.57 - 9.561.00 mg/dL 2.130.66  Sodium 086135 - 578145 mmol/L -  Potassium 3.5 - 5.1 mmol/L -  Chloride 101 - 111 mmol/L -  CO2 22 - 32 mmol/L -  Calcium 8.9 - 10.3 mg/dL -  Total Protein 6.5 - 8.1 g/dL -  Total Bilirubin 0.3 - 1.2 mg/dL -  Alkaline Phos 38 - 469126 U/L -  AST 15 - 41 U/L -  ALT 14 - 54 U/L -    Discharge instruction: per After Visit Summary and "Baby and Me Booklet".  After visit meds:  Allergies as  of 12/19/2016   No Known Allergies     Medication List    STOP taking these medications   ranitidine 150 MG tablet Commonly known as:  ZANTAC     TAKE these medications   acetaminophen 500 MG tablet Commonly known as:  TYLENOL Take 500 mg by mouth every 6 (six) hours as needed for mild pain or headache.   amLODipine 10 MG tablet Commonly known as:  NORVASC Take 1 tablet (10 mg total) by mouth daily.   ibuprofen 600 MG tablet Commonly known as:  ADVIL,MOTRIN Take 1 tablet (600 mg total) by mouth every 6 (six) hours.   VITAFOL FE+ 90-1-200 & 50 MG Cppk Take 2 capsules by mouth daily.       Diet: routine diet  Activity: Advance as tolerated. Pelvic rest for 6 weeks.   Outpatient follow up:4 weeks postpartum exam, B/P check Friday Follow up Appt:No future appointments. Follow up Visit:No Follow-up on file.  Postpartum contraception: Nexplanon  Newborn Data: Live born female  Birth Weight: 8 lb 7.6 oz (3845 g) APGAR: 7, 9  Baby Feeding: Breast Disposition:home with mother   12/19/2016 Roe Coombs, CNM

## 2016-12-19 NOTE — Progress Notes (Signed)
Post Partum Day #2 Subjective: no complaints, up ad lib, voiding, tolerating PO and + flatus  Objective: Blood pressure 133/75, pulse 72, temperature 98.1 F (36.7 C), temperature source Oral, resp. rate 18, height  (1.575 m), weight 184 lb (83.5 kg), last menstrual period 03/14/2016, SpO2 98 %.  Physical Exam:  General: alert, cooperative, appears stated age and no distress Lochia: appropriate Uterine Fundus: firm Incision: 1st deg perineal healing well DVT Evaluation: No evidence of DVT seen on physical exam. No cords or calf tenderness. No significant calf/ankle edema.   Recent Labs  12/17/16 1034  HGB 10.9*  HCT 31.4*    Assessment/Plan: Discharge home, Breastfeeding and Contraception Nexplanon planned   LOS: 2 days   Roe Coombs, CNM 12/19/2016, 7:38 AM

## 2016-12-20 DIAGNOSIS — Z3483 Encounter for supervision of other normal pregnancy, third trimester: Secondary | ICD-10-CM

## 2016-12-24 ENCOUNTER — Encounter: Payer: Medicaid Other | Admitting: Certified Nurse Midwife

## 2016-12-25 ENCOUNTER — Encounter: Payer: Self-pay | Admitting: *Deleted

## 2017-01-14 ENCOUNTER — Ambulatory Visit (INDEPENDENT_AMBULATORY_CARE_PROVIDER_SITE_OTHER): Payer: Medicaid Other | Admitting: Certified Nurse Midwife

## 2017-03-04 ENCOUNTER — Telehealth: Payer: Self-pay | Admitting: *Deleted

## 2017-03-04 NOTE — Telephone Encounter (Signed)
Received paperwork from pt employer regarding Disability Accommodations.  LM on VM making pt aware more information is needed regarding this paperwork. Asked pt to contact office.

## 2017-05-24 ENCOUNTER — Encounter (HOSPITAL_COMMUNITY): Payer: Self-pay

## 2017-11-14 IMAGING — US US MFM OB COMP +14 WKS
1 series · 12 of 28 positions shown · non-contrast
Comparison: none

[Series 1: us mfm ob comp +14 wks · 107 acquisitions, 12 frames shown]
[im 4/107]
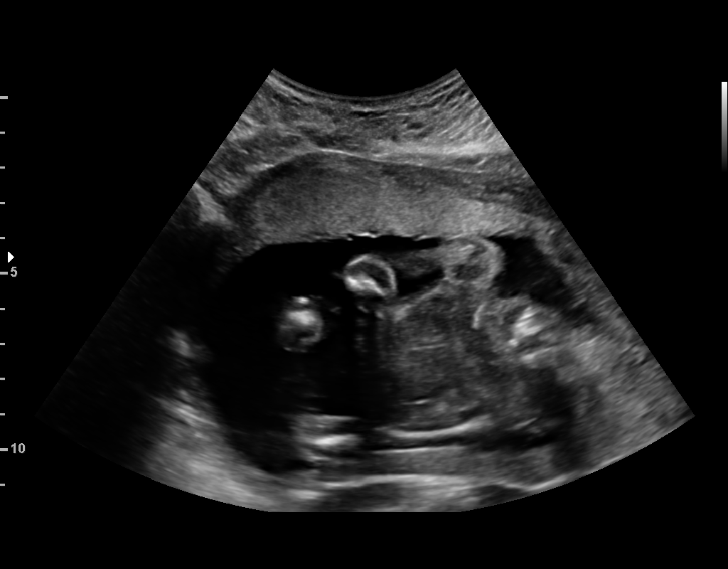
[im 12/107]
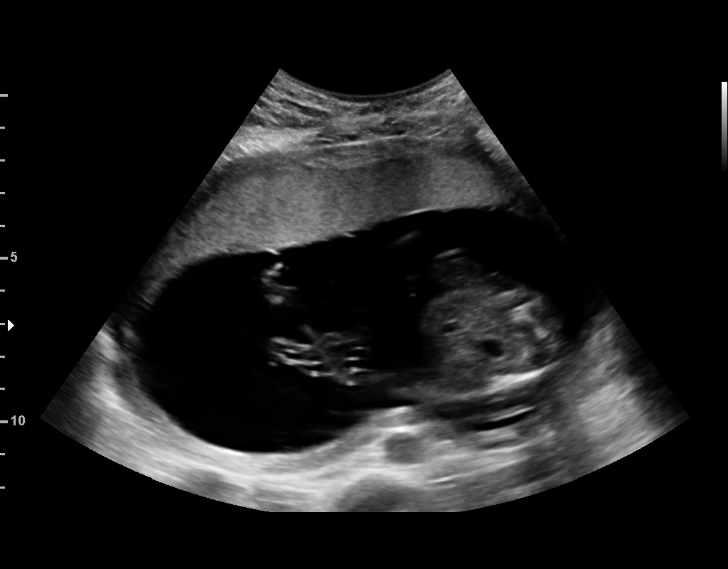
[im 20/107]
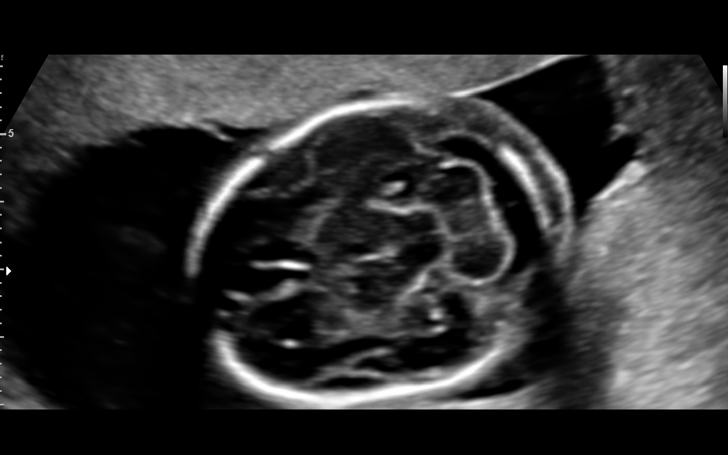
[im 32/107]
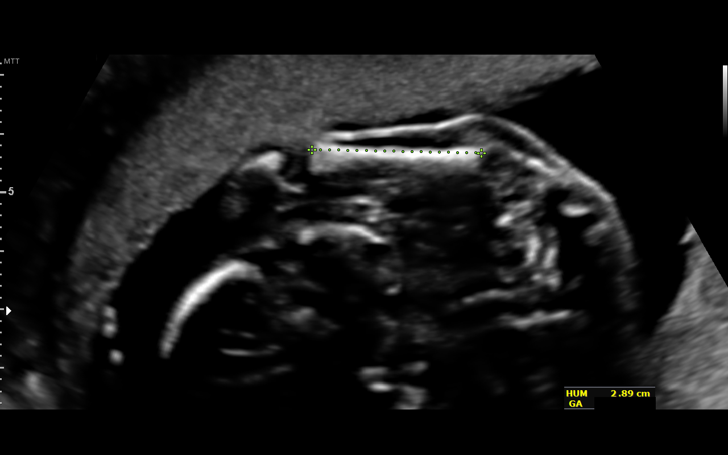
[im 40/107]
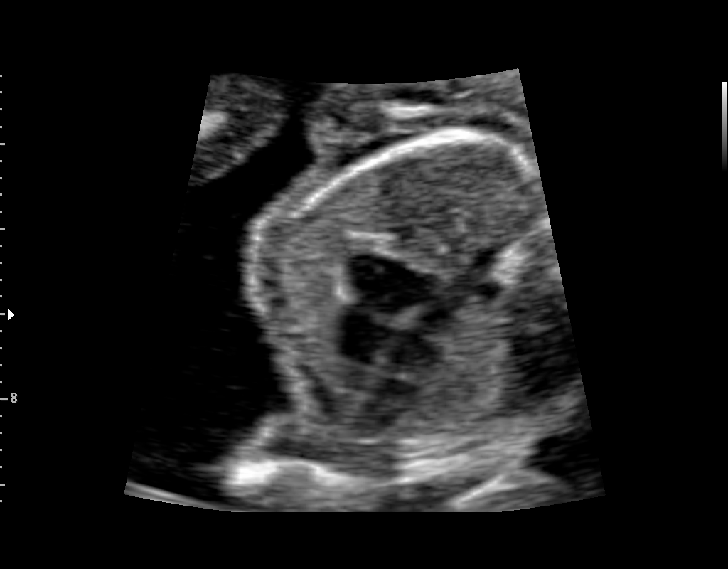
[im 48/107]
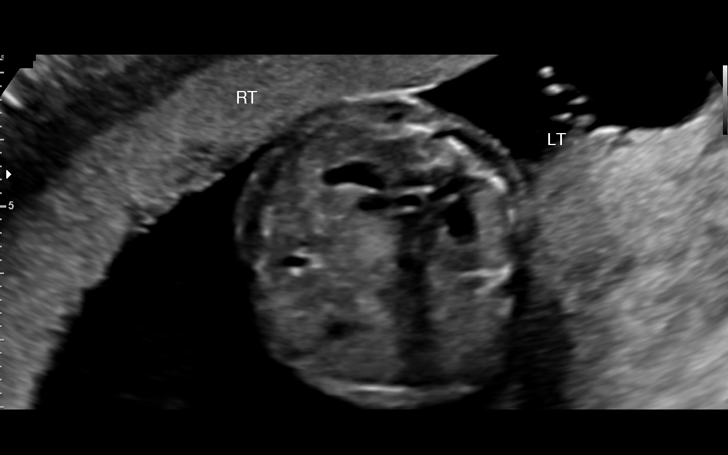
[im 59/107]
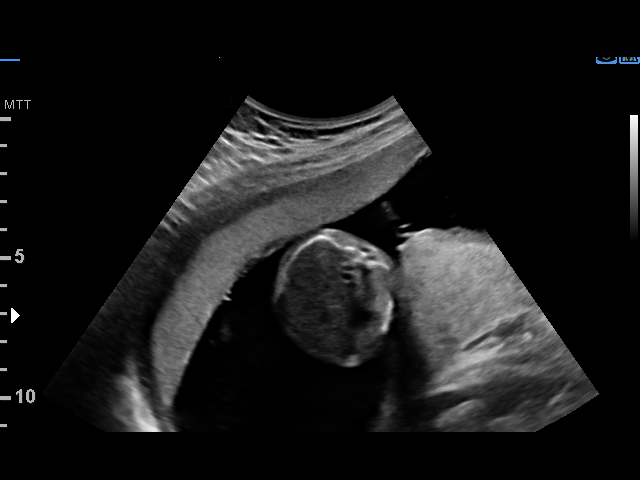
[im 67/107]
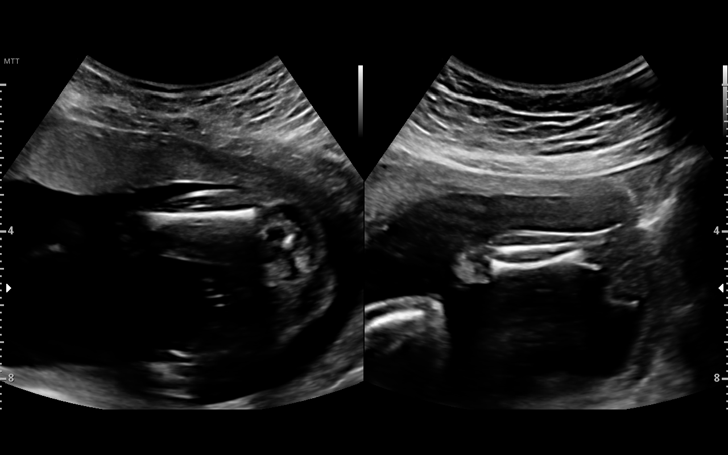
[im 75/107]
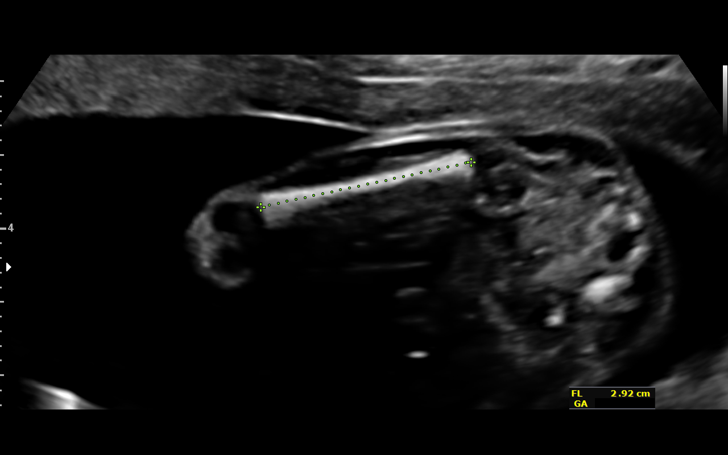
[im 87/107]
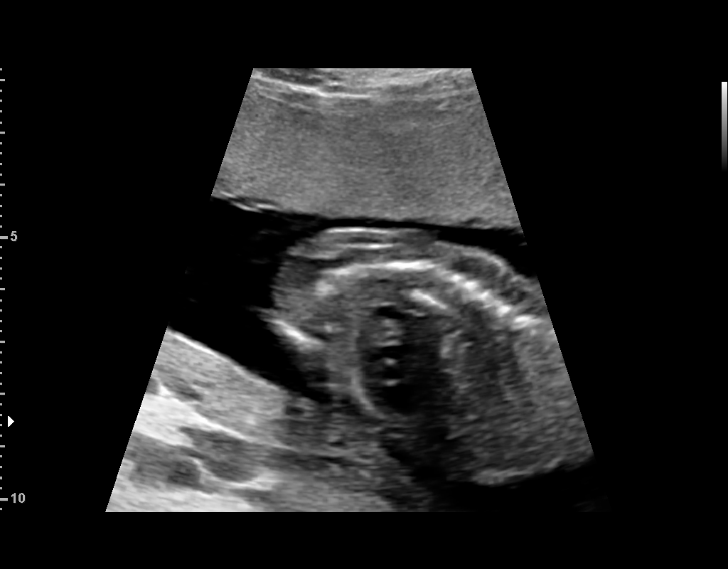
[im 95/107]
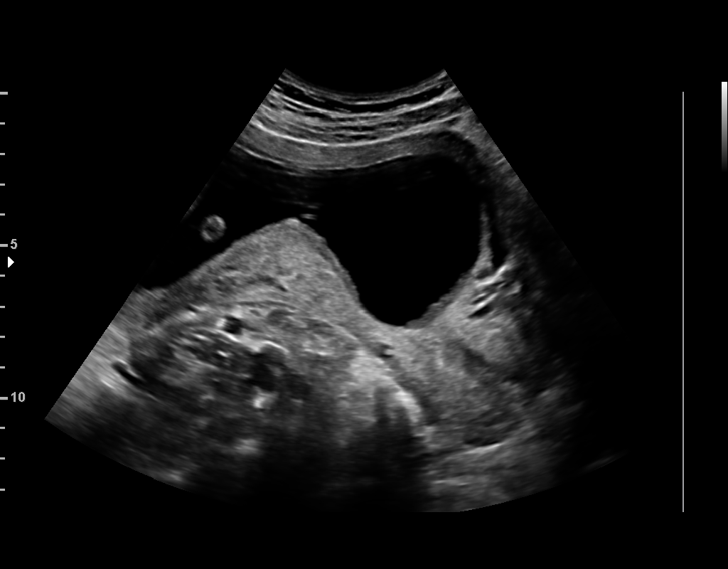
[im 103/107]
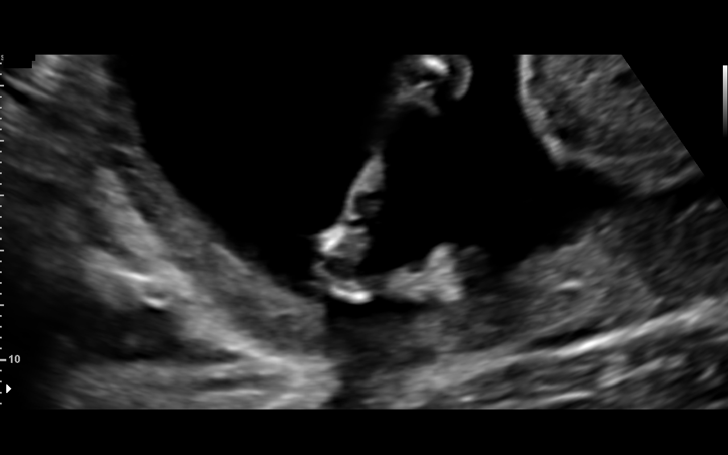

[12 of 28 positions shown; findings below may reference images not displayed]

Performed By:     Krispy Padatsu BS,       Secondary Phy.:   [REDACTED] [HOSPITAL]
OB/Gyn Clinic

Indications

19 weeks gestation of pregnancy
Encounter for antenatal screening for
malformations
OB History

Blood Type:            Height:  5'2"   Weight (lb):  142       BMI:
Gravidity:    2         Term:   1        Prem:   0        SAB:   0
TOP:          0       Ectopic:  0        Living: 1
Fetal Evaluation

Num Of Fetuses:     1
Fetal Heart         143
Rate(bpm):
Cardiac Activity:   Observed
Presentation:       Transverse, head to maternal right
Placenta:           Anterior, above cervical os
P. Cord Insertion:  Visualized, central

Amniotic Fluid
AFI FV:      Subjectively within normal limits
Largest Pocket(cm)
5.3
Biometry

BPD:      42.4  mm     G. Age:  18w 6d         31  %    CI:        72.73   %    70 - 86
FL/HC:      18.5   %    16.1 -
HC:      158.1  mm     G. Age:  18w 5d         18  %    HC/AC:      1.09        1.09 -
AC:      145.5  mm     G. Age:  19w 6d         64  %    FL/BPD:     68.9   %
FL:       29.2  mm     G. Age:  19w 0d         33  %    FL/AC:      20.1   %    20 - 24
HUM:        29  mm     G. Age:  19w 3d         55  %
CER:        19  mm     G. Age:  18w 4d         29  %
NFT:       4.4  mm

CM:        4.9  mm

Est. FW:     288  gm    0 lb 10 oz      47  %
Gestational Age

LMP:           19w 2d        Date:  03/14/16                 EDD:   12/19/16
U/S Today:     19w 1d                                        EDD:   12/20/16
Best:          19w 2d     Det. By:  LMP  (03/14/16)          EDD:   12/19/16
Anatomy

Cranium:               Appears normal         Aortic Arch:            Appears normal
Cavum:                 Appears normal         Ductal Arch:            Not well visualized
Ventricles:            Appears normal         Diaphragm:              Appears normal
Choroid Plexus:        Appears normal         Stomach:                Appears normal, left
sided
Cerebellum:            Appears normal         Abdomen:                Appears normal
Posterior Fossa:       Appears normal         Abdominal Wall:         Appears nml (cord
insert, abd wall)
Nuchal Fold:           Appears normal         Cord Vessels:           Appears normal (3
vessel cord)
Face:                  Orbits nl; profile not Kidneys:                Bilat pyelectasis,
well visualized
Rt 4 mm, Lt 4mm
Lips:                  Appears normal         Bladder:                Appears normal
Thoracic:              Appears normal         Spine:                  Appears normal
Heart:                 Echogenic focus        Upper Extremities:      Appears normal
in LV
RVOT:                  Not well visualized    Lower Extremities:      Appears normal
LVOT:                  Not well visualized

Other:  Parents do not wish to know sex of fetus. Fetus appears to be a male.
Heels and Right 5th digit visualized. Technically difficult due to fetal
position.
Cervix Uterus Adnexa

Cervix
Length:            3.7  cm.
Normal appearance by transabdominal scan.

Uterus
No abnormality visualized.

Left Ovary
Size(cm)     2.84   x   2.67   x  1.9       Vol(ml):
Within normal limits. No adnexal mass visualized.
Right Ovary
Size(cm)     2.46   x   2.42   x  1.96      Vol(ml):
Within normal limits. No adnexal mass visualized.

Cul De Sac:   No free fluid seen.

Adnexa:       No abnormality visualized.
Comments

An echogenic focus was seen in the left cardiac ventricle.
However, no gross fetal anomalies or other soft markers of
aneuploidy were identified.   An echogenic intracardiac focus
is felt to represent a calcified papillary muscle, and is not
associated with structural or functional cardiac abnormalities.
Additionally, mild urinary tract dilation (renal pylectasis) is
noted.  Both of these findings are considered soft markers for
Down syndrome and while as isolated findings they do not
increase the risk for Down syndrome in any appreciable
fashion, would offer genetic counseling given two soft
markers.
Impression

Single IUP at 19w 1d
An echognic intracardiac focus was noted in the left ventricle
Mild, bilateral urinary tract dilation was noted. Both renal
pelvises measure 
 4mm without calyceal dilation (UTD A1)
Limited views of the fetal heart were obtained
Ultrasound measurements are consistent with LMP
Anterior placenta without previa
Normal amniotic fluid volume
Recommendations

Would offer genetic counseling (two soft markers - echogenic
intracardiac focus and urinary tract dilation)
Recommend follow-up ultrasound examination in 4 weeks to
reevaluate the fetal heart

## 2017-12-12 IMAGING — US US MFM OB FOLLOW-UP
1 series · 13 of 28 positions shown · non-contrast
Comparison: none

[Series 1: us mfm ob follow-up · 72 acquisitions, 13 frames shown]
[im 3/72]
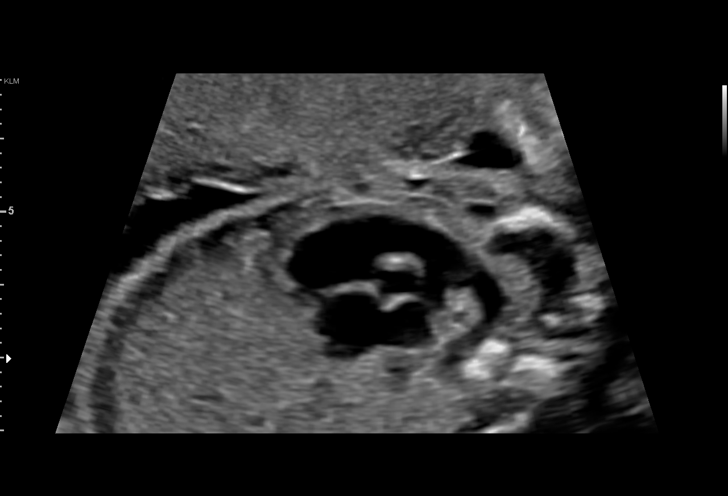
[im 8/72]
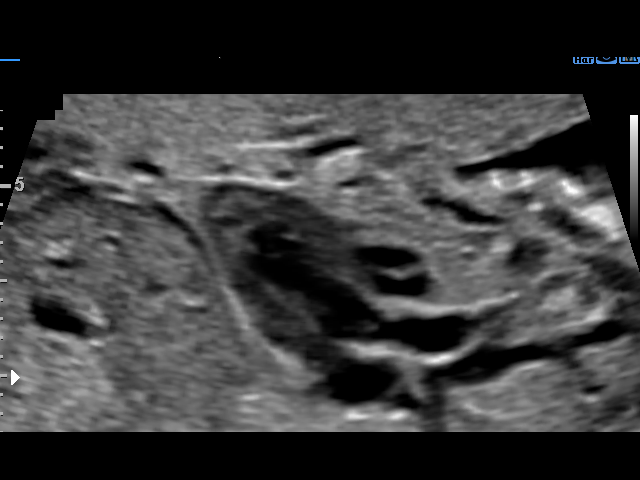
[im 14/72]
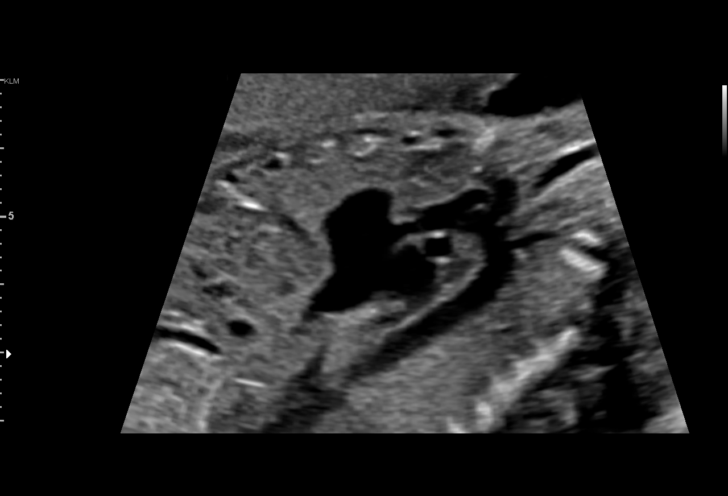
[im 19/72]
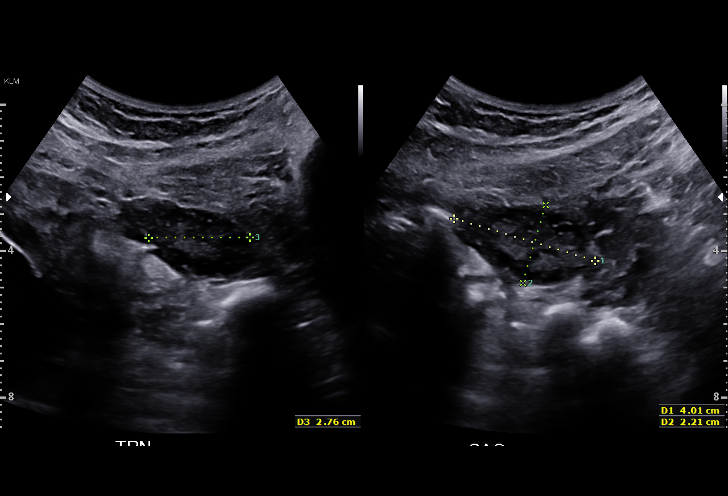
[im 24/72]
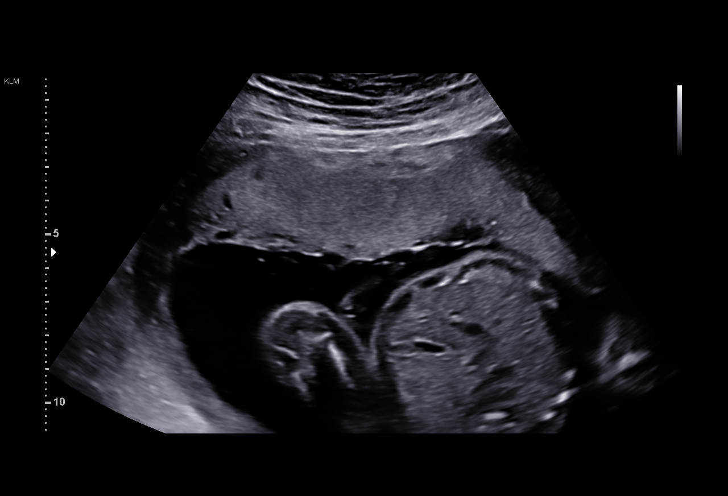
[im 29/72]
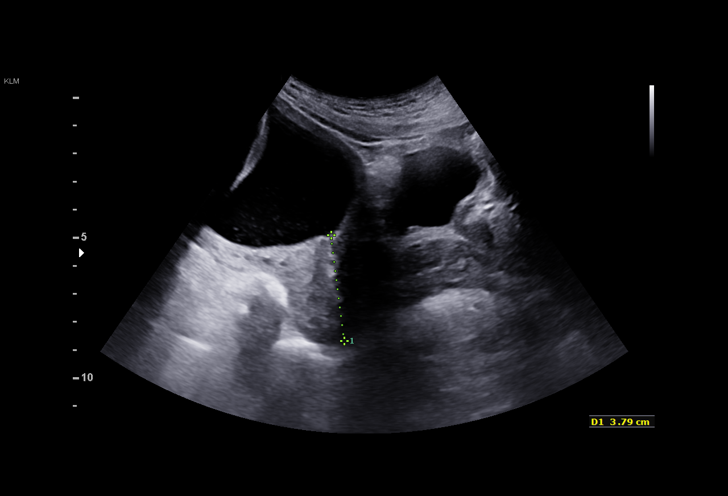
[im 37/72]
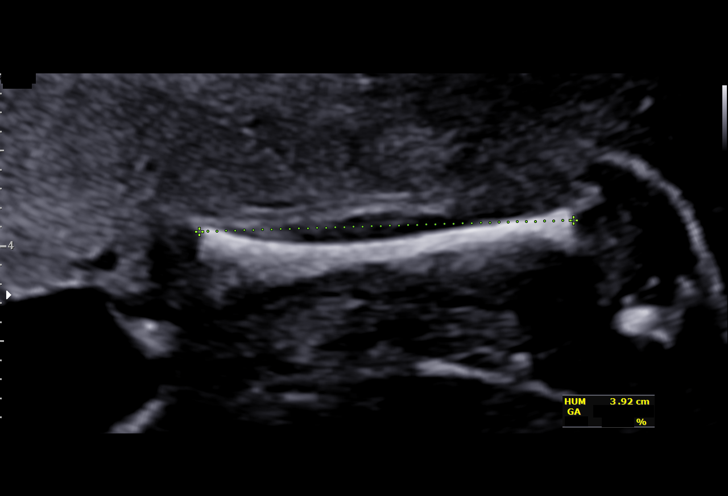
[im 43/72]
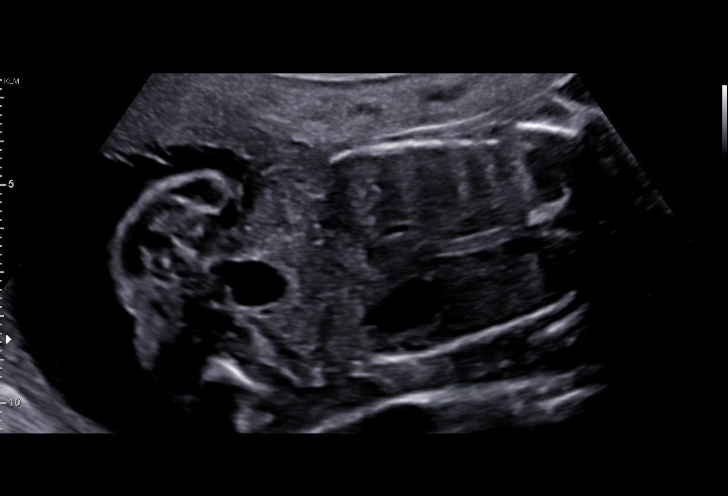
[im 48/72]
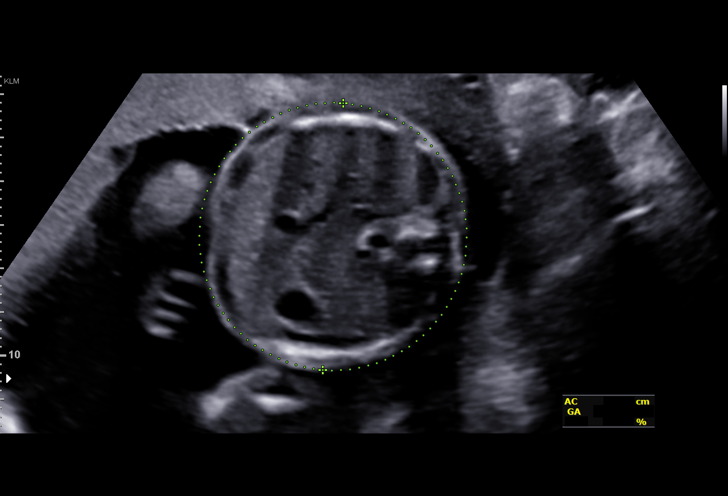
[im 53/72]
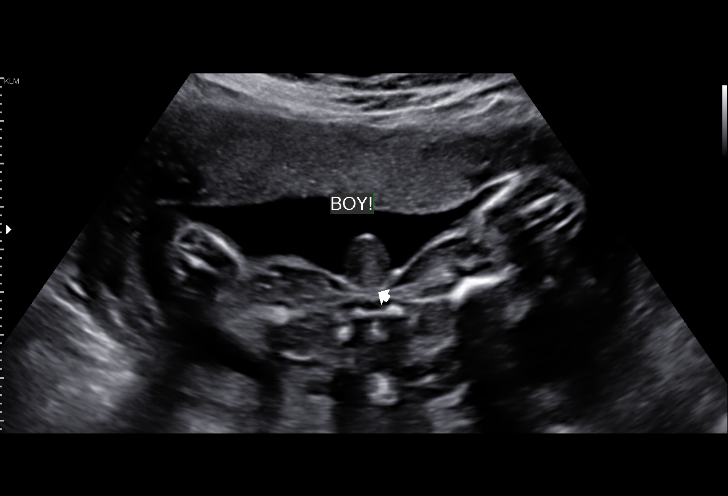
[im 58/72]
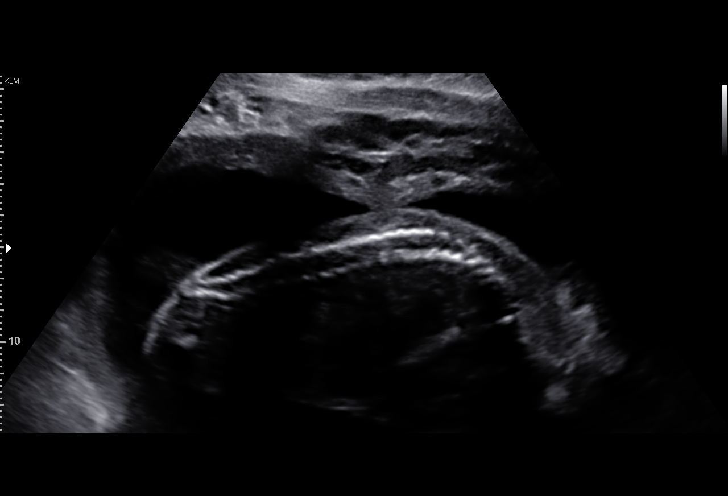
[im 64/72]
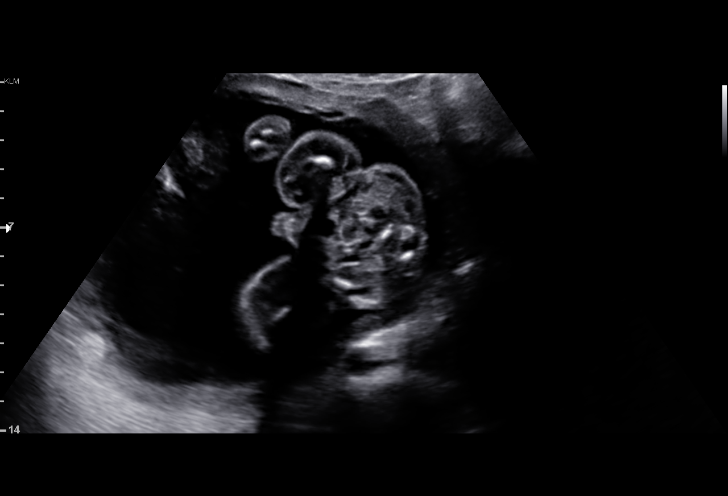
[im 69/72]
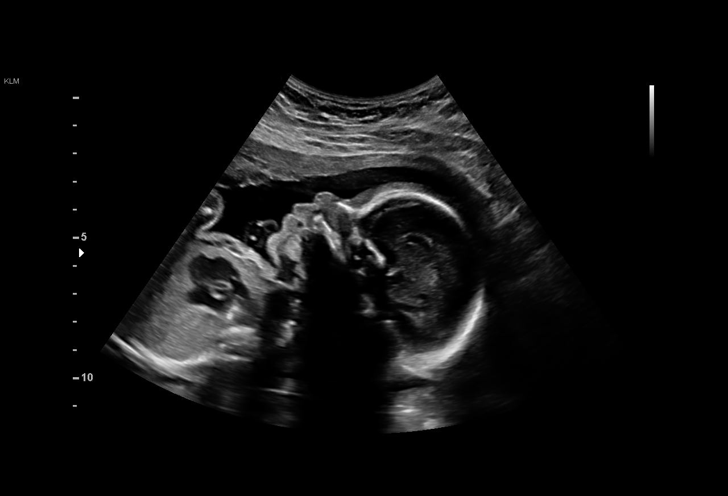

[13 of 28 positions shown; findings below may reference images not displayed]

Performed By:     Diocelina Conlan        Secondary Phy.:   [REDACTED] [HOSPITAL]
OB/Gyn Clinic

Indications

23 weeks gestation of pregnancy
Encounter for other antenatal screening
follow-up
Antenatal follow-up for nonvisualized fetal
anatomy
Pyelectasis of fetus on prenatal ultrasound
Echogenic intracardiac focus of the heart
(EIF)
OB History

Blood Type:            Height:  5'2"   Weight (lb):  142      BMI:
Gravidity:    2         Term:   1        Prem:   0        SAB:   0
TOP:          0       Ectopic:  0        Living: 1
Fetal Evaluation

Num Of Fetuses:     1
Fetal Heart         143
Rate(bpm):
Cardiac Activity:   Observed
Presentation:       Cephalic
Placenta:           Anterior, above cervical os
P. Cord Insertion:  Previously Visualized
Amniotic Fluid
AFI FV:      Subjectively within normal limits

Largest Pocket(cm)
4.7
Biometry

BPD:      54.6  mm     G. Age:  22w 4d         21  %    CI:        71.03   %   70 - 86
FL/HC:      19.9   %   19.2 -
HC:      206.4  mm     G. Age:  22w 5d         17  %    HC/AC:      1.06       1.05 -
AC:      195.3  mm     G. Age:  24w 2d         71  %    FL/BPD:     75.1   %   71 - 87
FL:         41  mm     G. Age:  23w 2d         39  %    FL/AC:      21.0   %   20 - 24
HUM:      39.6  mm     G. Age:  24w 1d         62  %

Est. FW:     615  gm      1 lb 6 oz     59  %
Gestational Age

LMP:           23w 2d       Date:   03/14/16                 EDD:   12/19/16
U/S Today:     23w 2d                                        EDD:   12/19/16
Best:          23w 2d    Det. By:   LMP  (03/14/16)          EDD:   12/19/16
Anatomy

Cranium:               Appears normal         Aortic Arch:            Appears normal
Cavum:                 Previously seen        Ductal Arch:            Appears normal
Ventricles:            Appears normal         Diaphragm:              Previously seen
Choroid Plexus:        Previously seen        Stomach:                Appears normal, left
sided
Cerebellum:            Previously seen        Abdomen:                Appears normal
Posterior Fossa:       Previously seen        Abdominal Wall:         Previously seen
Nuchal Fold:           Previously seen        Cord Vessels:           Previously seen
Face:                  Orbits previously      Kidneys:                Appear normal
seen, Profile nl
Lips:                  Previously seen        Bladder:                Appears normal
Thoracic:              Appears normal         Spine:                  Previously seen
Heart:                 Echogenic focus        Upper Extremities:      Previously seen
in LV
RVOT:                  Appears normal         Lower Extremities:      Previously seen
LVOT:                  Appears normal

Other:  Fetus appears to be a male. Heels and Right 5th digit previously
visualized. Technically difficult due to fetal position.
Cervix Uterus Adnexa

Cervix
Length:            3.8  cm.
Normal appearance by transabdominal scan.

Uterus
No abnormality visualized.

Left Ovary
Within normal limits.

Right Ovary
Within normal limits.

Adnexa:       No abnormality visualized. No adnexal mass
visualized.
Comments

Mild urinary tract dilation (UTD 1) was seen on her last exam.
The renal pelvices measured normal bilaterally on today's
ultrasound.  Although the presence of two soft markers on
her ultrasound did increase the risk of aneuploidy, she has
since had normal cell-free DNA tesing.  Thus, at present the
risk of aneuploidy in Ms. SIAMBANGO SOLOCHI fetus is very low.  I
reviewed this with her and given the low risk she declined
genetic counseling.
Impression

Single living intrauterine pregnancy at 23 weeks 2 days.
Appropriate fetal growth (59%).
Normal amniotic fluid volume.
The fetal anatomic survey is complete.
Known echogenic intracardiac focus.
Resolved bilateral urinary tract dilation.
Normal fetal anatomy.
No fetal anomalies or soft markers of aneuploidy seen.
Recommendations

Follow-up ultrasounds as clinically indicated.

## 2019-06-10 ENCOUNTER — Other Ambulatory Visit: Payer: Self-pay

## 2019-06-10 DIAGNOSIS — Z20822 Contact with and (suspected) exposure to covid-19: Secondary | ICD-10-CM

## 2019-06-11 LAB — NOVEL CORONAVIRUS, NAA: SARS-CoV-2, NAA: NOT DETECTED

## 2019-06-17 ENCOUNTER — Other Ambulatory Visit: Payer: Self-pay

## 2019-06-17 DIAGNOSIS — Z20822 Contact with and (suspected) exposure to covid-19: Secondary | ICD-10-CM

## 2019-06-18 LAB — NOVEL CORONAVIRUS, NAA: SARS-CoV-2, NAA: NOT DETECTED

## 2022-08-07 ENCOUNTER — Ambulatory Visit: Payer: BC Managed Care – PPO | Attending: Internal Medicine | Admitting: Internal Medicine

## 2022-08-07 ENCOUNTER — Encounter: Payer: Self-pay | Admitting: Internal Medicine

## 2022-08-07 VITALS — BP 118/74 | HR 56 | Temp 98.0°F | Ht 62.0 in | Wt 156.4 lb

## 2022-08-07 DIAGNOSIS — K439 Ventral hernia without obstruction or gangrene: Secondary | ICD-10-CM

## 2022-08-07 DIAGNOSIS — E663 Overweight: Secondary | ICD-10-CM

## 2022-08-07 DIAGNOSIS — Z2821 Immunization not carried out because of patient refusal: Secondary | ICD-10-CM | POA: Diagnosis not present

## 2022-08-07 DIAGNOSIS — Z7689 Persons encountering health services in other specified circumstances: Secondary | ICD-10-CM | POA: Diagnosis not present

## 2022-08-07 NOTE — Patient Instructions (Addendum)
Hernia, Adult     A hernia is the bulging of an organ or tissue through a weak spot in the muscles of the abdomen. Hernias develop most often near the belly button (navel) or the area where the leg meets the lower abdomen (groin). Common types of hernias include: Incisional hernia. This type bulges through a scar from an abdominal surgery. Umbilical hernia. This type develops near the navel. Inguinal hernia. This type develops in the groin or scrotum. Femoral hernia. This type develops below the groin, in the upper thigh area. Hiatal hernia. This type occurs when part of the stomach slides above the muscle that separates the abdomen from the chest (diaphragm). What are the causes? This condition may be caused by: Heavy lifting. Coughing over a long period of time. Straining to have a bowel movement. Constipation can lead to straining. An incision made during abdominal surgery. A physical problem that is present at birth (congenital defect). Being overweight or obese. Smoking. Excess fluid in the abdomen. Undescended testicles in males. What are the signs or symptoms? The main symptom is a skin-colored, rounded bulge in the area of the hernia. However, a bulge may not always be present. It may grow bigger or be more visible when you cough or strain (such as when lifting something heavy). A hernia that can be pushed back into the abdomen (is reducible) rarely causes pain. A hernia that cannot be pushed back into the abdomen (is incarcerated) may lose its blood supply (become strangulated). A hernia that is incarcerated may cause: Pain. Fever. Nausea and vomiting. Swelling. Constipation. How is this diagnosed? A hernia may be diagnosed based on: Your symptoms and medical history. A physical exam. Your health care provider may ask you to cough or move in certain ways to see if the hernia becomes visible. Imaging tests, such as: X-rays. Ultrasound. CT scan. How is this treated? A  hernia that is small and painless may not need to be treated. A hernia that is large or painful may be treated with surgery. Inguinal hernias may be treated with surgery to prevent incarceration or strangulation. Strangulated hernias are always treated with surgery because the strangulation causes a lack of blood supply to the trapped organ or tissue. Surgery to treat a hernia involves pushing the bulge back into place and repairing the weak area of the muscle or abdominal wall. Follow these instructions at home: Activity Avoid straining. Do not lift anything that is heavier than 10 lb (4.5 kg), or the limit that you are told, until your health care provider says that it is safe. When lifting heavy objects, lift with your leg muscles, not your back muscles. Preventing constipation Take actions to prevent constipation. Constipation leads to straining with bowel movements, which can make a hernia worse or cause a hernia repair to break down. Your health care provider may recommend that you take these actions to prevent or treat constipation: Drink enough fluid to keep your urine pale yellow. Take over-the-counter or prescription medicines. Eat foods that are high in fiber, such as beans, whole grains, and fresh fruits and vegetables. Limit foods that are high in fat and processed sugars, such as fried or sweet foods. General instructions When coughing, try to cough gently. You may try to push the hernia back in place by very gently pressing on it while lying down. Do not try to force the bulge back in if it will not push in easily. If you are overweight, work with your health care provider  to lose weight safely. Do not use any products that contain nicotine or tobacco. These products include cigarettes, chewing tobacco, and vaping devices, such as e-cigarettes. If you need help quitting, ask your health care provider. If you are scheduled for hernia repair, watch your hernia for any changes in shape,  size, or color. Tell your health care provider about any changes or new symptoms. Take over-the-counter and prescription medicines only as told by your health care provider. Keep all follow-up visits. This is important. Contact a health care provider if: You develop new pain, swelling, or redness around your hernia. You have signs of constipation, such as: Fewer bowel movements in a week than normal. Difficulty having a bowel movement. Stools that are dry, hard, or larger than normal. Get help right away if: You have a fever or chills. You have abdominal pain that gets worse. You feel nauseous or you vomit. You cannot push the hernia back in place by very gently pressing on it while lying down. Do not try to force the bulge back in if it will not go in easily. The hernia: Changes in shape, size, or color. Feels hard or tender. These symptoms may represent a serious problem that is an emergency. Do not wait to see if the symptoms will go away. Get medical help right away. Call your local emergency services (911 in the [place] Do not drive yourself to the hospital. Summary A hernia is the bulging of an organ or tissue through a weak spot in the muscles of the abdomen. The main symptom is a skin-colored bulge in the hernia area. However, a bulge may not always be present. It may grow bigger or more visible when you cough or strain (such as when having a bowel movement). A hernia that is small and painless may not need to be treated. A hernia that is large or painful may be treated with surgery. Surgery to treat a hernia involves pushing the bulge back into place and repairing the weak part of the abdomen. This information is not intended to replace advice given to you by your health care provider. Make sure you discuss any questions you have with your health care provider. Document Revised: 05/16/2020 Document Reviewed: 05/16/2020 Elsevier Patient Education  [place] Following a healthy eating pattern may help you to achieve and maintain a healthy body weight, reduce the risk of chronic disease, and live a long and productive life. It is important to follow a healthy eating pattern at an appropriate calorie level for your body. Your nutritional needs should be met primarily through food by choosing a variety of nutrient-rich foods. What are tips for following this plan? Reading food labels Read labels and choose the following: Reduced or low sodium. Juices with 100% fruit juice. Foods with low saturated fats and high polyunsaturated and monounsaturated fats. Foods with whole grains, such as whole wheat, cracked wheat, brown rice, and wild rice. Whole grains that are fortified with folic acid. This is recommended for women who are pregnant or who want to become pregnant. Read labels and avoid the following: Foods with a lot of added sugars. These include foods that contain brown sugar, corn sweetener, corn syrup, dextrose, fructose, glucose, high-fructose corn syrup, honey, invert sugar, lactose, malt syrup, maltose, molasses, raw sugar, sucrose, trehalose, or turbinado sugar. Do not eat more than the following amounts of added sugar per day: 6 teaspoons (25 g) for women. 9 teaspoons (38 g) for men. Foods that  contain processed or refined starches and grains. Refined grain products, such as white flour, degermed cornmeal, white bread, and white rice. Shopping Choose nutrient-rich snacks, such as vegetables, whole fruits, and nuts. Avoid high-calorie and high-sugar snacks, such as potato chips, fruit snacks, and candy. Use oil-based dressings and spreads on foods instead of solid fats such as butter, stick margarine, or cream cheese. Limit pre-made sauces, mixes, and "instant" products such as flavored rice, instant noodles, and ready-made pasta. Try more plant-protein sources, such as tofu, tempeh, black beans, edamame, lentils, nuts, and  seeds. Explore eating plans such as the Mediterranean diet or vegetarian diet. Cooking Use oil to saut or stir-fry foods instead of solid fats such as butter, stick margarine, or lard. Try baking, boiling, grilling, or broiling instead of frying. Remove the fatty part of meats before cooking. Steam vegetables in water or broth. Meal planning  At meals, imagine dividing your plate into fourths: One-half of your plate is fruits and vegetables. One-fourth of your plate is whole grains. One-fourth of your plate is protein, especially lean meats, poultry, eggs, tofu, beans, or nuts. Include low-fat dairy as part of your daily diet. Lifestyle Choose healthy options in all settings, including home, work, school, restaurants, or stores. Prepare your food safely: Wash your hands after handling raw meats. Keep food preparation surfaces clean by regularly washing with hot, soapy water. Keep raw meats separate from ready-to-eat foods, such as fruits and vegetables. Cook seafood, meat, poultry, and eggs to the recommended internal temperature. Store foods at safe temperatures. In general: Keep cold foods at 61F (4.4C) or below. Keep hot foods at 161F (60C) or above. Keep your freezer at [facility] (-17.8C) or below. Foods are no longer safe to eat when they have been between the temperatures of 40-161F (4.4-60C) for more than 2 hours. What foods should I eat? Fruits Aim to eat 2 cup-equivalents of fresh, canned (in natural juice), or frozen fruits each day. Examples of 1 cup-equivalent of fruit include 1 small apple, 8 large strawberries, 1 cup canned fruit,  cup dried fruit, or 1 cup 100% juice. Vegetables Aim to eat 2-3 cup-equivalents of fresh and frozen vegetables each day, including different varieties and colors. Examples of 1 cup-equivalent of vegetables include 2 medium carrots, 2 cups raw, leafy greens, 1 cup chopped vegetable (raw or cooked), or 1 medium baked potato. Grains Aim to  eat 6 ounce-equivalents of whole grains each day. Examples of 1 ounce-equivalent of grains include 1 slice of bread, 1 cup ready-to-eat cereal, 3 cups popcorn, or  cup cooked rice, pasta, or cereal. Meats and other proteins Aim to eat 5-6 ounce-equivalents of protein each day. Examples of 1 ounce-equivalent of protein include 1 egg, 1/2 cup nuts or seeds, or 1 tablespoon (16 g) peanut butter. A cut of meat or fish that is the size of a deck of cards is about 3-4 ounce-equivalents. Of the protein you eat each week, try to have at least 8 ounces come from seafood. This includes salmon, trout, herring, and anchovies. Dairy Aim to eat 3 cup-equivalents of fat-free or low-fat dairy each day. Examples of 1 cup-equivalent of dairy include 1 cup (240 mL) milk, 8 ounces (250 g) yogurt, 1 ounces (44 g) natural cheese, or 1 cup (240 mL) fortified soy milk. Fats and oils Aim for about 5 teaspoons (21 g) per day. Choose monounsaturated fats, such as canola and olive oils, avocados, peanut butter, and most nuts, or polyunsaturated fats, such as sunflower, corn, and soybean  oils, walnuts, pine nuts, sesame seeds, sunflower seeds, and flaxseed. Beverages Aim for six 8-oz glasses of water per day. Limit coffee to three to five 8-oz cups per day. Limit caffeinated beverages that have added calories, such as soda and energy drinks. Limit alcohol intake to no more than 1 drink a day for nonpregnant women and 2 drinks a day for men. One drink equals 12 oz of beer (355 mL), 5 oz of wine (148 mL), or 1 oz of hard liquor (44 mL). Seasoning and other foods Avoid adding excess amounts of salt to your foods. Try flavoring foods with herbs and spices instead of salt. Avoid adding sugar to foods. Try using oil-based dressings, sauces, and spreads instead of solid fats. This information is based on general [place] nutrition guidelines. For more information, visit [employer]. Exact amounts may vary based on your nutrition  needs. Summary A healthy eating plan may help you to maintain a healthy weight, reduce the risk of chronic diseases, and stay active throughout your life. Plan your meals. Make sure you eat the right portions of a variety of nutrient-rich foods. Try baking, boiling, grilling, or broiling instead of frying. Choose healthy options in all settings, including home, work, school, restaurants, or stores. This information is not intended to replace advice given to you by your health care provider. Make sure you discuss any questions you have with your health care provider. Document Revised: 06/06/2021 Document Reviewed: 06/06/2021 Elsevier Patient Education  [place].

## 2022-08-07 NOTE — Progress Notes (Signed)
Patient ID: Michelle Norman, female    DOB: 1991-03-02  MRN: [id number]  CC: Establish Care (Est care/ new pt. Discuss poss hernia, bulge on upper abdomen, tender X3 yrs. /No to flu vax.)   Subjective: Michelle Norman is a 31 y.o. female who presents for new pt visit Her concerns today include:  Gestational HTN  Pt to est care. No previous PCP in [place].  Was on Norvasc for short period after giving birth to her child in 2018.  She was able to be taken off of it as blood pressure then normalized.    Thinks she has abdominal hernia Has a knot in her abdomen above the umbilicus x 3 yrs If she gain wgh it will get tender to touch.  More noticeable with weight loss.  Endorses more pronounced also with heavy lifting.  It has not increased in size  No pain with meals, no N/V. Has BM 2x/wk.   Works out 4x/wk.  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 "Eating habits are not the best." Usually skips BF. Gets in 1-2 meals a day.  Does cook but eat out daily for lunch or dinner but not necessarily fast foods - [employer], [place] [employer].  Admits that she loves sodas.  Also drinks a lot of water.  She has maintain her wgh.   HM:  declined flu shot.  Last pap 2018.    Patient Active Problem List   Diagnosis Date Noted   Antepartum mild preeclampsia 12/17/2016   Gestational hypertension 12/17/2016   Elevated blood-pressure reading without diagnosis of hypertension 12/12/2016   GBS (group B Streptococcus carrier), +RV culture, currently pregnant 12/03/2016   Supervision of other normal pregnancy, antepartum 07/19/2016   Late prenatal care affecting pregnancy in second trimester 07/19/2016     No current outpatient medications on file prior to visit.   No current facility-administered medications on file prior to visit.    No Known Allergies  Social History   Socioeconomic History   Marital status: Single    Spouse name: Not on file   Number of children: Not on file   Years of education: Not on  file   Highest education level: Not on file  Occupational History   Not on file  Tobacco Use   Smoking status: Never   Smokeless tobacco: Never  Vaping Use   Vaping Use: Never used  Substance and Sexual Activity   Alcohol use: No    Comment: Only in gatherings   Drug use: No   Sexual activity: Yes    Birth control/protection: None  Other Topics Concern   Not on file  Social History Narrative   Not on file   Social Determinants of Health   Financial Resource Strain: Not on file  Food Insecurity: Not on file  Transportation Needs: Not on file  Physical Activity: Not on file  Stress: Not on file  Social Connections: Not on file  Intimate Partner Violence: Not on file    Family History  Problem Relation Age of Onset   High blood pressure Mother    Cancer Paternal Grandmother     Past Surgical History:  Procedure Laterality Date   LIPOSUCTION     NO PAST SURGERIES      ROS: Review of Systems Negative except as stated above  PHYSICAL EXAM: BP 118/74   Pulse (!) 56   Temp 98 F (36.7 C) (Oral)   Ht 5\' 2"  (1.575 m)   Wt 156 lb 6.4 oz (  70.9 kg)   SpO2 100%   BMI 28.61 kg/m    Physical Exam  General appearance - alert, well appearing, young African-American female and in no distress Mental status - normal mood, behavior, speech, dress, motor activity, and thought processes Chest - clear to auscultation, no wheezes, rales or rhonchi, symmetric air entry Heart - normal rate, regular rhythm, normal S1, S2, no murmurs, rubs, clicks or gallops Abdomen -abdomen soft and nontender.  No organomegaly.  She has about a 4 cm firm area above the umbilicus in the midline with appreciable weakness in the abdominal wall and bulging with increased abdominal pressure      Latest Ref Rng & Units 12/17/2016   10:38 AM 12/17/2016   10:34 AM 12/10/2016    4:33 PM  CMP  Glucose 65 - 99 mg/dL  80    BUN 6 - 20 mg/dL  5    Creatinine 0.57 - 1.00 mg/dL 0.66  0.66  0.65   Sodium  135 - 145 mmol/L  135    Potassium 3.5 - 5.1 mmol/L  3.8    Chloride 101 - 111 mmol/L  108    CO2 22 - 32 mmol/L  20    Calcium 8.9 - 10.3 mg/dL  8.7    Total Protein 6.5 - 8.1 g/dL  6.1    Total Bilirubin 0.3 - 1.2 mg/dL  0.4    Alkaline Phos 38 - 126 U/L  274    AST 15 - 41 U/L  20  19   ALT 14 - 54 U/L  12  10    CBC    Component Value Date/Time   WBC 5.1 12/17/2016 1034   RBC 3.35 (L) 12/17/2016 1034   HGB 10.9 (L) 12/17/2016 1034   HGB 11.6 12/10/2016 1633   HCT 31.4 (L) 12/17/2016 1034   HCT 35.0 12/10/2016 1633   PLT 184 12/17/2016 1034   PLT 224 12/10/2016 1633   MCV 93.7 12/17/2016 1034   MCV 95 12/10/2016 1633   MCH 32.5 12/17/2016 1034   MCHC 34.7 12/17/2016 1034   RDW 13.0 12/17/2016 1034   RDW 13.5 12/10/2016 1633   LYMPHSABS 1.2 12/10/2016 1626   EOSABS 0.0 12/10/2016 1626   BASOSABS 0.0 12/10/2016 1626    ASSESSMENT AND PLAN:  1. Establishing care with new doctor, encounter for   2. Ventral hernia without obstruction or gangrene Discussed diagnosis with patient including what causes hernia.  Advised that sometimes bowel can get trapped between the muscle.  When this occurs it can cause bowel obstruction with symptoms of abdominal pain, vomiting and unable to pass the bowel.  If this occurs, she should be seen in the emergency room.  Advised to avoid heavy lifting and even with exercise, activities that may increase abdominal pressure.  If she does a little bit of weight lifting and still wants to do abdominal crunches, she should wear a support band around the abdomen but I strongly advise against abdominal crunches. -She is not that symptomatic at this time.  I gave the option of doing a CT scan to confirm diagnoses and referring to general surgery versus observation.  Patient opted for the latter.  She declined CAT scan at this time.  She knows to follow-up if she develops symptoms, or it increases in size.  3. Overweight (BMI 25.0-29.9) Patient advised to  eliminate sugary drinks from the diet, cut back on portion sizes especially of white carbohydrates, eat more white lean meat like chicken [place]  and seafood instead of beef or pork and incorporate fresh fruits and vegetables into the diet daily. Commended her on regular exercise.  Encouraged her to keep up the good works  4. Influenza vaccination declined Recommended.  Patient declined.   Patient was given the opportunity to ask questions.  Patient verbalized understanding of the plan and was able to repeat key elements of the plan.   This documentation was completed using Radio producer.  Any transcriptional errors are unintentional.  No orders of the defined types were placed in this encounter.    Requested Prescriptions    No prescriptions requested or ordered in this encounter    No follow-ups on file.  Karle Plumber, MD, FACP

## 2022-09-21 ENCOUNTER — Ambulatory Visit: Payer: BC Managed Care – PPO | Admitting: Internal Medicine

## 2022-10-30 ENCOUNTER — Other Ambulatory Visit (HOSPITAL_COMMUNITY)
Admission: RE | Admit: 2022-10-30 | Discharge: 2022-10-30 | Disposition: A | Discharge status: Home / Self Care | Payer: BC Managed Care – PPO | Source: Ambulatory Visit | Attending: Family Medicine | Admitting: Family Medicine

## 2022-10-30 ENCOUNTER — Encounter: Payer: Self-pay | Admitting: Internal Medicine

## 2022-10-30 ENCOUNTER — Ambulatory Visit: Payer: BC Managed Care – PPO | Attending: Family Medicine | Admitting: Internal Medicine

## 2022-10-30 VITALS — BP 125/76 | HR 60 | Temp 98.0°F | Ht 62.0 in | Wt 156.0 lb

## 2022-10-30 DIAGNOSIS — Z124 Encounter for screening for malignant neoplasm of cervix: Secondary | ICD-10-CM

## 2022-10-30 NOTE — Progress Notes (Signed)
Patient ID: Michelle Norman, female    DOB: [birth year]  MRN: [id number]  CC: Gynecologic Exam (Pap. Yes to STI testting./No to flu vax. )   Subjective: Michelle Norman is a 32 y.o. female who presents for chronic ds management Her concerns today include:   GYN History:  Pt is G2P2 Any hx of abn paps?: no Menses regular or irregular?:  regular How long does menses last? 5 days Menstrual flow light or heavy?:  moderate.  Last menses 10/14/2022 Method of birth control?:  no Any vaginal dischg at this time?:  no Dysuria?: no Any hx of STI?: no Sexually active with how many partners: 1 female partner Desires STI screen:  yes Last MMG: NA Family hx of uterine, cervical or breast cancer?:  no  Patient Active Problem List   Diagnosis Date Noted   Antepartum mild preeclampsia 12/17/2016   Gestational hypertension 12/17/2016   Elevated blood-pressure reading without diagnosis of hypertension 12/12/2016   GBS (group B Streptococcus carrier), +RV culture, currently pregnant 12/03/2016   Supervision of other normal pregnancy, antepartum 07/19/2016   Late prenatal care affecting pregnancy in second trimester 07/19/2016     No current outpatient medications on file prior to visit.   No current facility-administered medications on file prior to visit.    No Known Allergies  Social History   Socioeconomic History   Marital status: Single    Spouse name: Not on file   Number of children: Not on file   Years of education: Not on file   Highest education level: Not on file  Occupational History   Not on file  Tobacco Use   Smoking status: Never   Smokeless tobacco: Never  Vaping Use   Vaping Use: Never used  Substance and Sexual Activity   Alcohol use: No    Comment: Only in gatherings   Drug use: No   Sexual activity: Yes    Birth control/protection: None  Other Topics Concern   Not on file  Social History Narrative   Not on file   Social Determinants of Health    Financial Resource Strain: Not on file  Food Insecurity: Not on file  Transportation Needs: Not on file  Physical Activity: Not on file  Stress: Not on file  Social Connections: Not on file  Intimate Partner Violence: Not on file    Family History  Problem Relation Age of Onset   High blood pressure Mother    Cancer Paternal Grandmother     Past Surgical History:  Procedure Laterality Date   LIPOSUCTION     NO PAST SURGERIES      ROS: Review of Systems Negative except as stated above  PHYSICAL EXAM: BP 125/76 (BP Location: Left Arm, Patient Position: Sitting, Cuff Size: Normal)   Pulse 60   Temp 98 F (36.7 C) (Oral)   Ht 5\' 2"  (1.575 m)   Wt 156 lb (70.8 kg)   SpO2 99%   BMI 28.53 kg/m   Physical Exam  General appearance - alert, well appearing, and in no distress Mental status - normal mood, behavior, speech, dress, motor activity, and thought processes Pelvic - CMA Clarrisa present:  normal external genitalia, vulva, vagina, cervix, uterus and adnexa      Latest Ref Rng & Units 12/17/2016   10:38 AM 12/17/2016   10:34 AM 12/10/2016    4:33 PM  CMP  Glucose 65 - 99 mg/dL  80    BUN 6 - 20 mg/dL  5  Creatinine 0.57 - 1.00 mg/dL 7.86  7.54  4.92   Sodium 135 - 145 mmol/L  135    Potassium 3.5 - 5.1 mmol/L  3.8    Chloride 101 - 111 mmol/L  108    CO2 22 - 32 mmol/L  20    Calcium 8.9 - 10.3 mg/dL  8.7    Total Protein 6.5 - 8.1 g/dL  6.1    Total Bilirubin 0.3 - 1.2 mg/dL  0.4    Alkaline Phos 38 - 126 U/L  274    AST 15 - 41 U/L  20  19   ALT 14 - 54 U/L  12  10    Lipid Panel  No results found for: "CHOL", "TRIG", "HDL", "CHOLHDL", "VLDL", "LDLCALC", "LDLDIRECT"  CBC    Component Value Date/Time   WBC 5.1 12/17/2016 1034   RBC 3.35 (L) 12/17/2016 1034   HGB 10.9 (L) 12/17/2016 1034   HGB 11.6 12/10/2016 1633   HCT 31.4 (L) 12/17/2016 1034   HCT 35.0 12/10/2016 1633   PLT 184 12/17/2016 1034   PLT 224 12/10/2016 1633   MCV 93.7  12/17/2016 1034   MCV 95 12/10/2016 1633   MCH 32.5 12/17/2016 1034   MCHC 34.7 12/17/2016 1034   RDW 13.0 12/17/2016 1034   RDW 13.5 12/10/2016 1633   LYMPHSABS 1.2 12/10/2016 1626   EOSABS 0.0 12/10/2016 1626   BASOSABS 0.0 12/10/2016 1626    ASSESSMENT AND PLAN: 1. Pap smear for cervical cancer screening - Cervicovaginal ancillary only - Cytology - PAP     Patient was given the opportunity to ask questions.  Patient verbalized understanding of the plan and was able to repeat key elements of the plan.   This documentation was completed using Paediatric nurse.  Any transcriptional errors are unintentional.  No orders of the defined types were placed in this encounter.    Requested Prescriptions    No prescriptions requested or ordered in this encounter    No follow-ups on file.  Jonah Blue, MD, FACP

## 2022-11-01 LAB — CYTOLOGY - PAP
Comment: NEGATIVE
Diagnosis: NEGATIVE
High risk HPV: NEGATIVE

## 2022-11-01 LAB — CERVICOVAGINAL ANCILLARY ONLY
Bacterial Vaginitis (gardnerella): NEGATIVE
Candida Glabrata: NEGATIVE
Candida Vaginitis: NEGATIVE
Chlamydia: NEGATIVE
Comment: NEGATIVE
Comment: NEGATIVE
Comment: NEGATIVE
Comment: NEGATIVE
Comment: NEGATIVE
Comment: NORMAL
Neisseria Gonorrhea: NEGATIVE
Trichomonas: NEGATIVE

## 2022-11-23 ENCOUNTER — Ambulatory Visit: Payer: BC Managed Care – PPO | Admitting: Internal Medicine
